# Patient Record
Sex: Female | Born: 1945 | Race: White | Hispanic: No | Marital: Single | Smoking: Never smoker
Health system: Southern US, Community
[De-identification: ages and names within clinical notes are randomized; demographics above are authoritative.]

## PROBLEM LIST (undated history)

## (undated) DIAGNOSIS — C801 Malignant (primary) neoplasm, unspecified: Secondary | ICD-10-CM

## (undated) DIAGNOSIS — E785 Hyperlipidemia, unspecified: Secondary | ICD-10-CM

## (undated) DIAGNOSIS — I10 Essential (primary) hypertension: Secondary | ICD-10-CM

## (undated) DIAGNOSIS — E119 Type 2 diabetes mellitus without complications: Secondary | ICD-10-CM

## (undated) HISTORY — PX: ABDOMINAL HYSTERECTOMY: SHX81

## (undated) HISTORY — PX: BREAST SURGERY: SHX581

---

## 2012-09-23 DIAGNOSIS — C50919 Malignant neoplasm of unspecified site of unspecified female breast: Secondary | ICD-10-CM | POA: Insufficient documentation

## 2018-09-01 ENCOUNTER — Encounter (HOSPITAL_COMMUNITY): Payer: Self-pay | Admitting: Emergency Medicine

## 2018-09-01 ENCOUNTER — Observation Stay (HOSPITAL_COMMUNITY)
Admission: EM | Admit: 2018-09-01 | Discharge: 2018-09-03 | Disposition: A | Discharge status: Home / Self Care | Payer: Medicare Other | Attending: Internal Medicine | Admitting: Internal Medicine

## 2018-09-01 ENCOUNTER — Emergency Department (HOSPITAL_COMMUNITY): Payer: Medicare Other

## 2018-09-01 ENCOUNTER — Other Ambulatory Visit: Payer: Self-pay

## 2018-09-01 DIAGNOSIS — F329 Major depressive disorder, single episode, unspecified: Secondary | ICD-10-CM | POA: Insufficient documentation

## 2018-09-01 DIAGNOSIS — S32512A Fracture of superior rim of left pubis, initial encounter for closed fracture: Principal | ICD-10-CM | POA: Insufficient documentation

## 2018-09-01 DIAGNOSIS — R51 Headache: Secondary | ICD-10-CM | POA: Insufficient documentation

## 2018-09-01 DIAGNOSIS — S3219XA Other fracture of sacrum, initial encounter for closed fracture: Secondary | ICD-10-CM | POA: Diagnosis not present

## 2018-09-01 DIAGNOSIS — Y9389 Activity, other specified: Secondary | ICD-10-CM | POA: Insufficient documentation

## 2018-09-01 DIAGNOSIS — Z853 Personal history of malignant neoplasm of breast: Secondary | ICD-10-CM | POA: Insufficient documentation

## 2018-09-01 DIAGNOSIS — S32511A Fracture of superior rim of right pubis, initial encounter for closed fracture: Secondary | ICD-10-CM | POA: Diagnosis not present

## 2018-09-01 DIAGNOSIS — E119 Type 2 diabetes mellitus without complications: Secondary | ICD-10-CM | POA: Insufficient documentation

## 2018-09-01 DIAGNOSIS — S32591A Other specified fracture of right pubis, initial encounter for closed fracture: Secondary | ICD-10-CM | POA: Diagnosis not present

## 2018-09-01 DIAGNOSIS — Z1159 Encounter for screening for other viral diseases: Secondary | ICD-10-CM | POA: Diagnosis not present

## 2018-09-01 DIAGNOSIS — E785 Hyperlipidemia, unspecified: Secondary | ICD-10-CM | POA: Insufficient documentation

## 2018-09-01 DIAGNOSIS — N179 Acute kidney failure, unspecified: Secondary | ICD-10-CM | POA: Diagnosis not present

## 2018-09-01 DIAGNOSIS — Y929 Unspecified place or not applicable: Secondary | ICD-10-CM | POA: Diagnosis not present

## 2018-09-01 DIAGNOSIS — I129 Hypertensive chronic kidney disease with stage 1 through stage 4 chronic kidney disease, or unspecified chronic kidney disease: Secondary | ICD-10-CM | POA: Insufficient documentation

## 2018-09-01 DIAGNOSIS — Y998 Other external cause status: Secondary | ICD-10-CM | POA: Diagnosis not present

## 2018-09-01 DIAGNOSIS — D72829 Elevated white blood cell count, unspecified: Secondary | ICD-10-CM | POA: Diagnosis present

## 2018-09-01 DIAGNOSIS — N183 Chronic kidney disease, stage 3 unspecified: Secondary | ICD-10-CM

## 2018-09-01 DIAGNOSIS — S3210XA Unspecified fracture of sacrum, initial encounter for closed fracture: Secondary | ICD-10-CM | POA: Diagnosis present

## 2018-09-01 DIAGNOSIS — S32810A Multiple fractures of pelvis with stable disruption of pelvic ring, initial encounter for closed fracture: Secondary | ICD-10-CM

## 2018-09-01 DIAGNOSIS — I10 Essential (primary) hypertension: Secondary | ICD-10-CM

## 2018-09-01 DIAGNOSIS — M25551 Pain in right hip: Secondary | ICD-10-CM | POA: Diagnosis present

## 2018-09-01 DIAGNOSIS — W19XXXA Unspecified fall, initial encounter: Secondary | ICD-10-CM

## 2018-09-01 DIAGNOSIS — D72828 Other elevated white blood cell count: Secondary | ICD-10-CM

## 2018-09-01 DIAGNOSIS — S32592A Other specified fracture of left pubis, initial encounter for closed fracture: Secondary | ICD-10-CM

## 2018-09-01 HISTORY — DX: Hyperlipidemia, unspecified: E78.5

## 2018-09-01 HISTORY — DX: Essential (primary) hypertension: I10

## 2018-09-01 HISTORY — DX: Type 2 diabetes mellitus without complications: E11.9

## 2018-09-01 HISTORY — DX: Malignant (primary) neoplasm, unspecified: C80.1

## 2018-09-01 LAB — GLUCOSE, CAPILLARY: Glucose-Capillary: 108 mg/dL — ABNORMAL HIGH (ref 70–99)

## 2018-09-01 LAB — COMPREHENSIVE METABOLIC PANEL
ALT: 22 U/L (ref 0–44)
AST: 30 U/L (ref 15–41)
Albumin: 3.8 g/dL (ref 3.5–5.0)
Alkaline Phosphatase: 41 U/L (ref 38–126)
Anion gap: 13 (ref 5–15)
BUN: 33 mg/dL — ABNORMAL HIGH (ref 8–23)
CO2: 25 mmol/L (ref 22–32)
Calcium: 9.3 mg/dL (ref 8.9–10.3)
Chloride: 101 mmol/L (ref 98–111)
Creatinine, Ser: 1.66 mg/dL — ABNORMAL HIGH (ref 0.44–1.00)
GFR calc Af Amer: 36 mL/min — ABNORMAL LOW (ref >60)
GFR calc non Af Amer: 31 mL/min — ABNORMAL LOW (ref >60)
Glucose, Bld: 159 mg/dL — ABNORMAL HIGH (ref 70–99)
Potassium: 4 mmol/L (ref 3.5–5.1)
Sodium: 139 mmol/L (ref 135–145)
Total Bilirubin: 0.7 mg/dL (ref 0.3–1.2)
Total Protein: 6.8 g/dL (ref 6.5–8.1)

## 2018-09-01 LAB — CBC WITH DIFFERENTIAL/PLATELET
Abs Immature Granulocytes: 0.12 10*3/uL — ABNORMAL HIGH (ref 0.00–0.07)
Basophils Absolute: 0 10*3/uL (ref 0.0–0.1)
Basophils Relative: 0 %
Eosinophils Absolute: 0.1 10*3/uL (ref 0.0–0.5)
Eosinophils Relative: 0 %
HCT: 38.6 % (ref 36.0–46.0)
Hemoglobin: 12.3 g/dL (ref 12.0–15.0)
Immature Granulocytes: 1 %
Lymphocytes Relative: 11 %
Lymphs Abs: 1.7 10*3/uL (ref 0.7–4.0)
MCH: 29.3 pg (ref 26.0–34.0)
MCHC: 31.9 g/dL (ref 30.0–36.0)
MCV: 91.9 fL (ref 80.0–100.0)
Monocytes Absolute: 0.7 10*3/uL (ref 0.1–1.0)
Monocytes Relative: 5 %
Neutro Abs: 12.4 10*3/uL — ABNORMAL HIGH (ref 1.7–7.7)
Neutrophils Relative %: 83 %
Platelets: 212 10*3/uL (ref 150–400)
RBC: 4.2 MIL/uL (ref 3.87–5.11)
RDW: 12.7 % (ref 11.5–15.5)
WBC: 15.1 10*3/uL — ABNORMAL HIGH (ref 4.0–10.5)
nRBC: 0 % (ref 0.0–0.2)

## 2018-09-01 LAB — SARS CORONAVIRUS 2 BY RT PCR (HOSPITAL ORDER, PERFORMED IN ~~LOC~~ HOSPITAL LAB): SARS Coronavirus 2: NEGATIVE

## 2018-09-01 MED ORDER — IBUPROFEN 600 MG PO TABS
600.0000 mg | ORAL_TABLET | Freq: Four times a day (QID) | ORAL | Status: DC
  Administered 2018-09-01 – 2018-09-02 (×2): 600 mg via ORAL
  Filled 2018-09-01 (×2): qty 1

## 2018-09-01 MED ORDER — ONDANSETRON HCL 4 MG/2ML IJ SOLN
4.0000 mg | Freq: Once | INTRAMUSCULAR | Status: AC
  Administered 2018-09-01: 4 mg via INTRAVENOUS
  Filled 2018-09-01: qty 2

## 2018-09-01 MED ORDER — FENTANYL CITRATE (PF) 100 MCG/2ML IJ SOLN
50.0000 ug | INTRAMUSCULAR | Status: DC | PRN
  Administered 2018-09-01: 50 ug via INTRAVENOUS
  Filled 2018-09-01: qty 2

## 2018-09-01 MED ORDER — ONDANSETRON HCL 4 MG/2ML IJ SOLN: 4.0000 mg | Freq: Four times a day (QID) | INTRAMUSCULAR | Status: DC | PRN

## 2018-09-01 MED ORDER — OXYCODONE-ACETAMINOPHEN 5-325 MG PO TABS
1.0000 | ORAL_TABLET | ORAL | Status: DC | PRN
  Administered 2018-09-02 – 2018-09-03 (×3): 1 via ORAL
  Filled 2018-09-01 (×3): qty 1

## 2018-09-01 MED ORDER — ONDANSETRON HCL 4 MG PO TABS: 4.0000 mg | ORAL_TABLET | Freq: Four times a day (QID) | ORAL | Status: DC | PRN

## 2018-09-01 MED ORDER — LORAZEPAM 2 MG/ML IJ SOLN
1.0000 mg | Freq: Once | INTRAMUSCULAR | Status: AC
  Administered 2018-09-01: 1 mg via INTRAVENOUS
  Filled 2018-09-01: qty 1

## 2018-09-01 MED ORDER — SODIUM CHLORIDE 0.9 % IV SOLN
INTRAVENOUS | Status: DC
  Administered 2018-09-01: 23:00:00 via INTRAVENOUS

## 2018-09-01 MED ORDER — SODIUM CHLORIDE 0.9 % IV BOLUS
1000.0000 mL | Freq: Once | INTRAVENOUS | Status: AC
  Administered 2018-09-01: 1000 mL via INTRAVENOUS

## 2018-09-01 MED ORDER — ENOXAPARIN SODIUM 60 MG/0.6ML ~~LOC~~ SOLN
50.0000 mg | SUBCUTANEOUS | Status: DC
  Administered 2018-09-01: 50 mg via SUBCUTANEOUS
  Filled 2018-09-01 (×2): qty 0.6

## 2018-09-01 NOTE — ED Triage Notes (Signed)
Pt fell off her horse and landed on RT hip. Pt did hit head, but denies LOC. Pt c/o RT hip and groin pain. No deformity, shortening, or rotation noted.

## 2018-09-01 NOTE — Progress Notes (Signed)
I have reviewed xrays and CT scan.  Pt has stable pelvic ring fractures and is appropriate to stay at [facility] for [facility] and mobilization.  She can be WBAT to BLE.  I will see her in the office in 2 weeks.   Deborah Warner

## 2018-09-01 NOTE — H&P (Signed)
History and Physical  Deborah Warner JKD:[id number] DOB: 1945-11-01 DOA: 09/01/2018  Referring physician: Dr Gilford Raid, ED physician PCP: System, Pcp Not In  Outpatient Specialists:   Patient Coming From: Home  Chief Complaint: Fall off horse with sacral pain  HPI: Deborah Warner is a 73 y.o. female with a history of endometrial cancer, diabetes, hypertension, hyperlipidemia.  Patient seen after a fall off her horse and landing on her buttocks.  Patient had immediate pain that was nonradiating and quite severe.  Worse with movement and improved with lying still.  CT shows superior and inferior pubic rami fractures bilaterally as well as mildly displaced right sacral fracture.  Emergency Department Course: Ortho consulted.  Fracture is nonoperable.  Patient to remain nonweightbearing.  Review of Systems:  Pt denies any fevers, chills, nausea, vomiting, diarrhea, constipation, abdominal pain, shortness of breath, dyspnea on exertion, orthopnea, cough, wheezing, palpitations, headache, vision changes, lightheadedness, dizziness, melena, rectal bleeding.  Review of systems are otherwise negative  Past Medical History:  Diagnosis Date  . Cancer ([place])    endometrial   . Diabetes mellitus without complication ([place])   . Hyperlipidemia   . Hypertension    Past Surgical History:  Procedure Laterality Date  . ABDOMINAL HYSTERECTOMY    . BREAST SURGERY     Social History:  reports that she has never smoked. She has never used smokeless tobacco. She reports current alcohol use. She reports that she does not use drugs. Patient lives at home  Allergies  Allergen Reactions  . Augmentin [Amoxicillin-Pot Clavulanate] Hives  . Codeine Palpitations    Family history of diabetes  Prior to Admission medications   Not on File    Physical Exam: BP (!) 86/48   Pulse 73   Temp 97.6 F (36.4 C)   Resp 20   Ht 5\' 6"  (1.676 m)   Wt 104.3 kg   SpO2 91%   BMI 37.12 kg/m   . General: Older  Caucasian female.. Awake and alert and oriented x3. No acute cardiopulmonary distress.  Marland Kitchen HEENT: Normocephalic atraumatic.  Right and left ears normal in appearance.  Pupils equal, round, reactive to light. Extraocular muscles are intact. Sclerae anicteric and noninjected.  Moist mucosal membranes. No mucosal lesions.  . Neck: Neck supple without lymphadenopathy. No carotid bruits. No masses palpated.  . Cardiovascular: Regular rate with normal S1-S2 sounds. No murmurs, rubs, gallops auscultated. No JVD.  Marland Kitchen Respiratory: Good respiratory effort with no wheezes, rales, rhonchi. Lungs clear to auscultation bilaterally.  No accessory muscle use. . Abdomen: Soft, nontender, nondistended. Active bowel sounds. No masses or hepatosplenomegaly  . Skin: No rashes, lesions, or ulcerations.  Dry, warm to touch. 2+ dorsalis pedis and radial pulses. . Musculoskeletal: Neurovascularly tact in legs bilaterally. Marland Kitchen Psychiatric: Intact judgment and insight. Pleasant and cooperative. . Neurologic: No focal neurological deficits.  Due to fracture, muscle testing not done..  Cranial nerves II through XII are grossly intact.           Labs on Admission: I have personally reviewed following labs and imaging studies  CBC: Recent Labs  Lab 09/01/18 1647  WBC 15.1*  NEUTROABS 12.4*  HGB 12.3  HCT 38.6  MCV 91.9  PLT 099   Basic Metabolic Panel: Recent Labs  Lab 09/01/18 1647  NA 139  K 4.0  CL 101  CO2 25  GLUCOSE 159*  BUN 33*  CREATININE 1.66*  CALCIUM 9.3   GFR: Estimated Creatinine Clearance: 37.9 mL/min (A) (by C-G formula based  on SCr of 1.66 mg/dL (H)). Liver Function Tests: Recent Labs  Lab 09/01/18 1647  AST 30  ALT 22  ALKPHOS 41  BILITOT 0.7  PROT 6.8  ALBUMIN 3.8   No results for input(s): LIPASE, AMYLASE in the last 168 hours. No results for input(s): AMMONIA in the last 168 hours. Coagulation Profile: No results for input(s): INR, PROTIME in the last 168 hours. Cardiac  Enzymes: No results for input(s): CKTOTAL, CKMB, CKMBINDEX, TROPONINI in the last 168 hours. BNP (last 3 results) No results for input(s): PROBNP in the last 8760 hours. HbA1C: No results for input(s): HGBA1C in the last 72 hours. CBG: No results for input(s): GLUCAP in the last 168 hours. Lipid Profile: No results for input(s): CHOL, HDL, LDLCALC, TRIG, CHOLHDL, LDLDIRECT in the last 72 hours. Thyroid Function Tests: No results for input(s): TSH, T4TOTAL, FREET4, T3FREE, THYROIDAB in the last 72 hours. Anemia Panel: No results for input(s): VITAMINB12, FOLATE, FERRITIN, TIBC, IRON, RETICCTPCT in the last 72 hours. Urine analysis: No results found for: COLORURINE, APPEARANCEUR, LABSPEC, PHURINE, GLUCOSEU, HGBUR, BILIRUBINUR, KETONESUR, PROTEINUR, UROBILINOGEN, NITRITE, LEUKOCYTESUR Sepsis Labs: @LABRCNTIP (procalcitonin:4,lacticidven:4) ) Recent Results (from the past 240 hour(s))  SARS Coronavirus 2 (CEPHEID - Performed in [facility] hospital lab), Hosp Order     Status: None   Collection Time: 09/01/18  5:24 PM  Result Value Ref Range Status   SARS Coronavirus 2 NEGATIVE NEGATIVE Final    Comment: (NOTE) If result is NEGATIVE SARS-CoV-2 target nucleic acids are NOT DETECTED. The SARS-CoV-2 RNA is generally detectable in upper and lower  respiratory specimens during the acute phase of infection. The lowest  concentration of SARS-CoV-2 viral copies this assay can detect is 250  copies / mL. A negative result does not preclude SARS-CoV-2 infection  and should not be used as the sole basis for treatment or other  patient management decisions.  A negative result may occur with  improper specimen collection / handling, submission of specimen other  than nasopharyngeal swab, presence of viral mutation(s) within the  areas targeted by this assay, and inadequate number of viral copies  (<250 copies / mL). A negative result must be combined with clinical  observations, patient history,  and epidemiological information. If result is POSITIVE SARS-CoV-2 target nucleic acids are DETECTED. The SARS-CoV-2 RNA is generally detectable in upper and lower  respiratory specimens dur ing the acute phase of infection.  Positive  results are indicative of active infection with SARS-CoV-2.  Clinical  correlation with patient history and other diagnostic information is  necessary to determine patient infection status.  Positive results do  not rule out bacterial infection or co-infection with other viruses. If result is PRESUMPTIVE POSTIVE SARS-CoV-2 nucleic acids MAY BE PRESENT.   A presumptive positive result was obtained on the submitted specimen  and confirmed on repeat testing.  While 2019 novel coronavirus  (SARS-CoV-2) nucleic acids may be present in the submitted sample  additional confirmatory testing may be necessary for epidemiological  and / or clinical management purposes  to differentiate between  SARS-CoV-2 and other Sarbecovirus currently known to infect humans.  If clinically indicated additional testing with an alternate test  methodology [phone]) is advised. The SARS-CoV-2 RNA is generally  detectable in upper and lower respiratory sp ecimens during the acute  phase of infection. The expected result is Negative. Fact Sheet for Patients:  StrictlyIdeas.no Fact Sheet for Healthcare Providers: BankingDealers.co.za This test is not yet approved or cleared by the [place] FDA and has  been authorized for detection and/or diagnosis of SARS-CoV-2 by FDA under an Emergency Use Authorization (EUA).  This EUA will remain in effect (meaning this test can be used) for the duration of the COVID-19 declaration under Section 564(b)(1) of the Act, 21 U.S.C. section 360bbb-3(b)(1), unless the authorization is terminated or revoked sooner. Performed at [facility], [address] [place], [place] [address]      Radiological  Exams on Admission: Dg Chest 2 View  Result Date: 09/01/2018 CLINICAL DATA:  Acute pain following fall from horse today. Initial encounter. EXAM: CHEST - 2 VIEW COMPARISON:  None. FINDINGS: The cardiomediastinal silhouette is unremarkable. There is no evidence of focal airspace disease, pulmonary edema, suspicious pulmonary nodule/mass, pleural effusion, or pneumothorax. No acute bony abnormalities are identified. IMPRESSION: No active cardiopulmonary disease. Electronically Signed   By: Margarette Canada M.D.   On: 09/01/2018 16:47   Dg Lumbar Spine Complete  Result Date: 09/01/2018 CLINICAL DATA:  Fall from horse with low back pain, initial encounter EXAM: LUMBAR SPINE - COMPLETE 4+ VIEW COMPARISON:  None. FINDINGS: Calcified gallstones are noted in the right upper quadrant. Five lumbar type vertebral bodies are well visualized. Body height is well maintained. Multilevel osteophytic changes and disc space narrowing noted. No pars defects are seen. No anterolisthesis is noted. IMPRESSION: Degenerative change without acute abnormality. Cholelithiasis in the upper quadrant. Electronically Signed   By: Inez Catalina M.D.   On: 09/01/2018 16:40   Ct Head Wo Contrast  Result Date: 09/01/2018 CLINICAL DATA:  Fall from horse today.  Posterior head neck pain. EXAM: CT HEAD WITHOUT CONTRAST CT CERVICAL SPINE WITHOUT CONTRAST TECHNIQUE: Multidetector CT imaging of the head and cervical spine was performed following the standard protocol without intravenous contrast. Multiplanar CT image reconstructions of the cervical spine were also generated. COMPARISON:  None. FINDINGS: CT HEAD FINDINGS Brain: The brainstem, cerebellum, cerebral peduncles, thalami, basal ganglia, basilar cisterns, and ventricular system appear within normal limits. No intracranial hemorrhage, mass lesion, or acute CVA. Vascular: There is atherosclerotic calcification of the cavernous carotid arteries bilaterally. Skull: Unremarkable Sinuses/Orbits: Mild  chronic right frontal sinusitis. Other: No supplemental non-categorized findings. CT CERVICAL SPINE FINDINGS Alignment: No vertebral subluxation is observed. Skull base and vertebrae: No fracture or acute bony findings. Mild pannus posterior to the odontoid measuring 4 mm in thickness. Degenerative disc disease with loss of disc height at C4-5, C5-6, and C6-7. Left facet arthropathy especially at C2-3 and C3-4. Soft tissues and spinal canal: Unremarkable Disc levels: Mild foraminal narrowing due to uncinate and facet spurring on the left at C4-5, C5-6, and C6-7; and on the right at C4-5 and C5-6. Suspected mild central narrowing of the thecal sac at C6-7 due to spurring and short pedicles. Upper chest: Unremarkable Other: No supplemental non-categorized findings. IMPRESSION: 1. No acute intracranial findings and no acute cervical spine findings. 2. Atherosclerosis. 3. Mild chronic right frontal sinusitis. 4. Cervical spondylosis and degenerative disc disease with suspected impingement at C4-5, C5-6, and C6-7 due to spurring. Electronically Signed   By: Van Clines M.D.   On: 09/01/2018 16:23   Ct Cervical Spine Wo Contrast  Result Date: 09/01/2018 CLINICAL DATA:  Fall from horse today.  Posterior head neck pain. EXAM: CT HEAD WITHOUT CONTRAST CT CERVICAL SPINE WITHOUT CONTRAST TECHNIQUE: Multidetector CT imaging of the head and cervical spine was performed following the standard protocol without intravenous contrast. Multiplanar CT image reconstructions of the cervical spine were also generated. COMPARISON:  None. FINDINGS: CT HEAD FINDINGS Brain:  The brainstem, cerebellum, cerebral peduncles, thalami, basal ganglia, basilar cisterns, and ventricular system appear within normal limits. No intracranial hemorrhage, mass lesion, or acute CVA. Vascular: There is atherosclerotic calcification of the cavernous carotid arteries bilaterally. Skull: Unremarkable Sinuses/Orbits: Mild chronic right frontal sinusitis.  Other: No supplemental non-categorized findings. CT CERVICAL SPINE FINDINGS Alignment: No vertebral subluxation is observed. Skull base and vertebrae: No fracture or acute bony findings. Mild pannus posterior to the odontoid measuring 4 mm in thickness. Degenerative disc disease with loss of disc height at C4-5, C5-6, and C6-7. Left facet arthropathy especially at C2-3 and C3-4. Soft tissues and spinal canal: Unremarkable Disc levels: Mild foraminal narrowing due to uncinate and facet spurring on the left at C4-5, C5-6, and C6-7; and on the right at C4-5 and C5-6. Suspected mild central narrowing of the thecal sac at C6-7 due to spurring and short pedicles. Upper chest: Unremarkable Other: No supplemental non-categorized findings. IMPRESSION: 1. No acute intracranial findings and no acute cervical spine findings. 2. Atherosclerosis. 3. Mild chronic right frontal sinusitis. 4. Cervical spondylosis and degenerative disc disease with suspected impingement at C4-5, C5-6, and C6-7 due to spurring. Electronically Signed   By: Van Clines M.D.   On: 09/01/2018 16:23   Ct Pelvis Wo Contrast  Result Date: 09/01/2018 CLINICAL DATA:  Recent pelvic trauma with no known pubic rami fractures, evaluate for additional fractures. EXAM: CT PELVIS WITHOUT CONTRAST TECHNIQUE: Multidetector CT imaging of the pelvis was performed following the standard protocol without intravenous contrast. COMPARISON:  None. FINDINGS: Urinary Tract:  Bladder is decompressed. Bowel:  Visualized large and small bowel is within normal limits. Vascular/Lymphatic: Vascular calcifications are seen without aneurysmal dilatation. No significant lymphadenopathy is noted. Reproductive:  Uterus has been surgically removed. Other:  No free fluid is noted.  No soft tissue abnormality is seen. Musculoskeletal: Degenerative changes of lumbar spine are noted. Mildly displaced fracture through the right sacral ala is seen no definitive left sacral fracture is  seen. Minimally displaced superior and inferior pubic rami fractures are noted bilaterally. No focal hematoma is noted. No proximal femoral fractures are seen. No acetabular involvement is seen. IMPRESSION: Superior and inferior pubic rami fractures bilaterally as well as mildly displaced right sacral fracture. Electronically Signed   By: Inez Catalina M.D.   On: 09/01/2018 18:20   Dg Hip Unilat W Or Wo Pelvis 2-3 Views Right  Result Date: 09/01/2018 CLINICAL DATA:  73 year old female with fall and right hip pain EXAM: DG HIP (WITH OR WITHOUT PELVIS) 2-3V RIGHT COMPARISON:  None. FINDINGS: Bilateral superior and inferior pubic rami fractures, minimally displaced. Bilateral hips projects normally over the acetabula. No acute proximal femur fracture identified on the left or the right. Degenerative changes of the bilateral hips. IMPRESSION: Bilateral minimally displaced fractures of the superior and inferior pubic ramus. Electronically Signed   By: Corrie Mckusick D.O.   On: 09/01/2018 16:40     Assessment/Plan: Principal Problem:   Sacral fracture, closed ([place]) Active Problems:   Bilateral pubic rami fractures ([facility])   Diabetes mellitus without complication ([place])   Essential hypertension   Leukocytosis   Acute renal injury ([place])    This patient was discussed with the ED physician, including pertinent vitals, physical exam findings, labs, and imaging.  We also discussed care given by the ED provider.  1. Sacral fracture a. Observation b. PT consult c. Pain control d. Care management consult e. Patient to remain nonweightbearing on the right side 2. Closed fracture pubic rami bilaterally a.  3. Diabetes a. Continue metformin 4. Hypertension a. We will hold antihypertensives as blood pressure little low 5. Leukocytosis a. CBC in the morning b. No sign of infection 6. Acute renal injury a. Recheck creatinine in the morning. b. IV fluids  DVT prophylaxis: Lovenox Consultants: Ortho  Code Status: Full code Family Communication: None Disposition Plan: Patient should be able to return home following observation   Truett Mainland, DO

## 2018-09-01 NOTE — ED Provider Notes (Signed)
[facility] EMERGENCY DEPARTMENT Provider Note   CSN: [id number] Arrival date & time: 09/01/18  1534    History   Chief Complaint Chief Complaint  Patient presents with  . Fall    HPI Deborah Warner is a 73 y.o. female.     Pt presents to the ED today with right hip pain after fall from horse.  Pt said she was cantering when the horse in front of her stopped suddenly.  This caused her to fall off her horse.  She has been unable to put any weight onto her hip since the fall.  She thinks she hit her head, but no loc.  She is not on blood thinners.  No known sick contacts.     Past Medical History:  Diagnosis Date  . Cancer ([place])    endometrial   . Diabetes mellitus without complication ([place])   . Hyperlipidemia   . Hypertension     There are no active problems to display for this patient.   Past Surgical History:  Procedure Laterality Date  . ABDOMINAL HYSTERECTOMY    . BREAST SURGERY       OB History   No obstetric history on file.      Home Medications    Prior to Admission medications   Not on File    Family History No family history on file.  Social History Social History   Tobacco Use  . Smoking status: Never Smoker  . Smokeless tobacco: Never Used  Substance Use Topics  . Alcohol use: Yes    Comment: occ  . Drug use: Never     Allergies   Augmentin [amoxicillin-pot clavulanate] and Codeine   Review of Systems Review of Systems  Musculoskeletal:       Right hip pain  All other systems reviewed and are negative.    Physical Exam Updated Vital Signs BP (!) 86/48   Pulse 73   Temp 97.6 F (36.4 C)   Resp 20   Ht 5\' 6"  (1.676 m)   Wt 104.3 kg   SpO2 91%   BMI 37.12 kg/m   Physical Exam Vitals signs and nursing note reviewed.  Constitutional:      Appearance: Normal appearance.  HENT:     Head: Normocephalic and atraumatic.     Right Ear: External ear normal.     Left Ear: External ear normal.     Nose: Nose normal.      Mouth/Throat:     Mouth: Mucous membranes are moist.     Pharynx: Oropharynx is clear.  Eyes:     Extraocular Movements: Extraocular movements intact.     Conjunctiva/sclera: Conjunctivae normal.     Pupils: Pupils are equal, round, and reactive to light.  Neck:     Musculoskeletal: Normal range of motion.  Cardiovascular:     Rate and Rhythm: Normal rate and regular rhythm.     Pulses: Normal pulses.     Heart sounds: Normal heart sounds.  Pulmonary:     Effort: Pulmonary effort is normal.     Breath sounds: Normal breath sounds.  Abdominal:     General: Abdomen is flat. Bowel sounds are normal.     Palpations: Abdomen is soft.  Musculoskeletal:     Right hip: She exhibits tenderness.  Skin:    General: Skin is warm.     Capillary Refill: Capillary refill takes less than 2 seconds.  Neurological:     General: No focal deficit present.  Mental Status: She is alert and oriented to person, place, and time.  Psychiatric:        Mood and Affect: Mood normal.        Behavior: Behavior normal.      ED Treatments / Results  Labs (all labs ordered are listed, but only abnormal results are displayed) Labs Reviewed  CBC WITH DIFFERENTIAL/PLATELET - Abnormal; Notable for the following components:      Result Value   WBC 15.1 (*)    Neutro Abs 12.4 (*)    Abs Immature Granulocytes 0.12 (*)    All other components within normal limits  COMPREHENSIVE METABOLIC PANEL - Abnormal; Notable for the following components:   Glucose, Bld 159 (*)    BUN 33 (*)    Creatinine, Ser 1.66 (*)    GFR calc non Af Amer 31 (*)    GFR calc Af Amer 36 (*)    All other components within normal limits  SARS CORONAVIRUS 2 (HOSPITAL ORDER, [place] [facility])    EKG None  Radiology Dg Chest 2 View  Result Date: 09/01/2018 CLINICAL DATA:  Acute pain following fall from horse today. Initial encounter. EXAM: CHEST - 2 VIEW COMPARISON:  None. FINDINGS: The cardiomediastinal  silhouette is unremarkable. There is no evidence of focal airspace disease, pulmonary edema, suspicious pulmonary nodule/mass, pleural effusion, or pneumothorax. No acute bony abnormalities are identified. IMPRESSION: No active cardiopulmonary disease. Electronically Signed   By: Margarette Canada M.D.   On: 09/01/2018 16:47   Dg Lumbar Spine Complete  Result Date: 09/01/2018 CLINICAL DATA:  Fall from horse with low back pain, initial encounter EXAM: LUMBAR SPINE - COMPLETE 4+ VIEW COMPARISON:  None. FINDINGS: Calcified gallstones are noted in the right upper quadrant. Five lumbar type vertebral bodies are well visualized. Body height is well maintained. Multilevel osteophytic changes and disc space narrowing noted. No pars defects are seen. No anterolisthesis is noted. IMPRESSION: Degenerative change without acute abnormality. Cholelithiasis in the upper quadrant. Electronically Signed   By: Inez Catalina M.D.   On: 09/01/2018 16:40   Ct Head Wo Contrast  Result Date: 09/01/2018 CLINICAL DATA:  Fall from horse today.  Posterior head neck pain. EXAM: CT HEAD WITHOUT CONTRAST CT CERVICAL SPINE WITHOUT CONTRAST TECHNIQUE: Multidetector CT imaging of the head and cervical spine was performed following the standard protocol without intravenous contrast. Multiplanar CT image reconstructions of the cervical spine were also generated. COMPARISON:  None. FINDINGS: CT HEAD FINDINGS Brain: The brainstem, cerebellum, cerebral peduncles, thalami, basal ganglia, basilar cisterns, and ventricular system appear within normal limits. No intracranial hemorrhage, mass lesion, or acute CVA. Vascular: There is atherosclerotic calcification of the cavernous carotid arteries bilaterally. Skull: Unremarkable Sinuses/Orbits: Mild chronic right frontal sinusitis. Other: No supplemental non-categorized findings. CT CERVICAL SPINE FINDINGS Alignment: No vertebral subluxation is observed. Skull base and vertebrae: No fracture or acute bony  findings. Mild pannus posterior to the odontoid measuring 4 mm in thickness. Degenerative disc disease with loss of disc height at C4-5, C5-6, and C6-7. Left facet arthropathy especially at C2-3 and C3-4. Soft tissues and spinal canal: Unremarkable Disc levels: Mild foraminal narrowing due to uncinate and facet spurring on the left at C4-5, C5-6, and C6-7; and on the right at C4-5 and C5-6. Suspected mild central narrowing of the thecal sac at C6-7 due to spurring and short pedicles. Upper chest: Unremarkable Other: No supplemental non-categorized findings. IMPRESSION: 1. No acute intracranial findings and no acute cervical spine findings. 2.  Atherosclerosis. 3. Mild chronic right frontal sinusitis. 4. Cervical spondylosis and degenerative disc disease with suspected impingement at C4-5, C5-6, and C6-7 due to spurring. Electronically Signed   By: Van Clines M.D.   On: 09/01/2018 16:23   Ct Cervical Spine Wo Contrast  Result Date: 09/01/2018 CLINICAL DATA:  Fall from horse today.  Posterior head neck pain. EXAM: CT HEAD WITHOUT CONTRAST CT CERVICAL SPINE WITHOUT CONTRAST TECHNIQUE: Multidetector CT imaging of the head and cervical spine was performed following the standard protocol without intravenous contrast. Multiplanar CT image reconstructions of the cervical spine were also generated. COMPARISON:  None. FINDINGS: CT HEAD FINDINGS Brain: The brainstem, cerebellum, cerebral peduncles, thalami, basal ganglia, basilar cisterns, and ventricular system appear within normal limits. No intracranial hemorrhage, mass lesion, or acute CVA. Vascular: There is atherosclerotic calcification of the cavernous carotid arteries bilaterally. Skull: Unremarkable Sinuses/Orbits: Mild chronic right frontal sinusitis. Other: No supplemental non-categorized findings. CT CERVICAL SPINE FINDINGS Alignment: No vertebral subluxation is observed. Skull base and vertebrae: No fracture or acute bony findings. Mild pannus posterior  to the odontoid measuring 4 mm in thickness. Degenerative disc disease with loss of disc height at C4-5, C5-6, and C6-7. Left facet arthropathy especially at C2-3 and C3-4. Soft tissues and spinal canal: Unremarkable Disc levels: Mild foraminal narrowing due to uncinate and facet spurring on the left at C4-5, C5-6, and C6-7; and on the right at C4-5 and C5-6. Suspected mild central narrowing of the thecal sac at C6-7 due to spurring and short pedicles. Upper chest: Unremarkable Other: No supplemental non-categorized findings. IMPRESSION: 1. No acute intracranial findings and no acute cervical spine findings. 2. Atherosclerosis. 3. Mild chronic right frontal sinusitis. 4. Cervical spondylosis and degenerative disc disease with suspected impingement at C4-5, C5-6, and C6-7 due to spurring. Electronically Signed   By: Van Clines M.D.   On: 09/01/2018 16:23   Ct Pelvis Wo Contrast  Result Date: 09/01/2018 CLINICAL DATA:  Recent pelvic trauma with no known pubic rami fractures, evaluate for additional fractures. EXAM: CT PELVIS WITHOUT CONTRAST TECHNIQUE: Multidetector CT imaging of the pelvis was performed following the standard protocol without intravenous contrast. COMPARISON:  None. FINDINGS: Urinary Tract:  Bladder is decompressed. Bowel:  Visualized large and small bowel is within normal limits. Vascular/Lymphatic: Vascular calcifications are seen without aneurysmal dilatation. No significant lymphadenopathy is noted. Reproductive:  Uterus has been surgically removed. Other:  No free fluid is noted.  No soft tissue abnormality is seen. Musculoskeletal: Degenerative changes of lumbar spine are noted. Mildly displaced fracture through the right sacral ala is seen no definitive left sacral fracture is seen. Minimally displaced superior and inferior pubic rami fractures are noted bilaterally. No focal hematoma is noted. No proximal femoral fractures are seen. No acetabular involvement is seen. IMPRESSION:  Superior and inferior pubic rami fractures bilaterally as well as mildly displaced right sacral fracture. Electronically Signed   By: Inez Catalina M.D.   On: 09/01/2018 18:20   Dg Hip Unilat W Or Wo Pelvis 2-3 Views Right  Result Date: 09/01/2018 CLINICAL DATA:  73 year old female with fall and right hip pain EXAM: DG HIP (WITH OR WITHOUT PELVIS) 2-3V RIGHT COMPARISON:  None. FINDINGS: Bilateral superior and inferior pubic rami fractures, minimally displaced. Bilateral hips projects normally over the acetabula. No acute proximal femur fracture identified on the left or the right. Degenerative changes of the bilateral hips. IMPRESSION: Bilateral minimally displaced fractures of the superior and inferior pubic ramus. Electronically Signed   By: Corrie Mckusick  D.O.   On: 09/01/2018 16:40    Procedures Procedures (including critical care time)  Medications Ordered in ED Medications  fentaNYL (SUBLIMAZE) injection 50 mcg (50 mcg Intravenous Given 09/01/18 1638)  sodium chloride 0.9 % bolus 1,000 mL (has no administration in time range)  ondansetron (ZOFRAN) injection 4 mg (4 mg Intravenous Given 09/01/18 1638)  LORazepam (ATIVAN) injection 1 mg (1 mg Intravenous Given 09/01/18 1820)     Initial Impression / Assessment and Plan / ED Course  I have reviewed the triage vital signs and the nursing notes.  Pertinent labs & imaging results that were available during my care of the patient were reviewed by me and considered in my medical decision making (see chart for details).     Pt d/w Dr. Veverly Fells (ortho) who recommended CT pelvis.  This was done which did show a right sided sacral fracture.  He said she can WBAT on the left, but she needs to be non weight bearing on the right.  She is not surgical and can stay here at [facility].  He can f/u after she is discharged.  Pt d/w Dr. Nehemiah Settle (triad) for admission.  BP and O2 sat dropped a little after pain meds.  Pt given 1L and put on 2L oxygen.  Final Clinical  Impressions(s) / ED Diagnoses   Final diagnoses:  Multiple closed fractures of pelvis with stable disruption of pelvic ring, initial encounter ([place])  Fall, initial encounter  Closed fracture of sacrum, unspecified portion of sacrum, initial encounter [facility])    ED Discharge Orders    None       Isla Pence, MD 09/01/18 1924

## 2018-09-02 ENCOUNTER — Observation Stay (HOSPITAL_COMMUNITY): Payer: Medicare Other

## 2018-09-02 DIAGNOSIS — N179 Acute kidney failure, unspecified: Secondary | ICD-10-CM | POA: Diagnosis not present

## 2018-09-02 DIAGNOSIS — S32810A Multiple fractures of pelvis with stable disruption of pelvic ring, initial encounter for closed fracture: Secondary | ICD-10-CM

## 2018-09-02 DIAGNOSIS — S32512A Fracture of superior rim of left pubis, initial encounter for closed fracture: Secondary | ICD-10-CM | POA: Diagnosis not present

## 2018-09-02 DIAGNOSIS — I10 Essential (primary) hypertension: Secondary | ICD-10-CM | POA: Diagnosis not present

## 2018-09-02 DIAGNOSIS — N183 Chronic kidney disease, stage 3 unspecified: Secondary | ICD-10-CM

## 2018-09-02 DIAGNOSIS — S3210XA Unspecified fracture of sacrum, initial encounter for closed fracture: Secondary | ICD-10-CM | POA: Diagnosis not present

## 2018-09-02 LAB — CBC
HCT: 34.1 % — ABNORMAL LOW (ref 36.0–46.0)
Hemoglobin: 10.9 g/dL — ABNORMAL LOW (ref 12.0–15.0)
MCH: 29.5 pg (ref 26.0–34.0)
MCHC: 32 g/dL (ref 30.0–36.0)
MCV: 92.2 fL (ref 80.0–100.0)
Platelets: 174 10*3/uL (ref 150–400)
RBC: 3.7 MIL/uL — ABNORMAL LOW (ref 3.87–5.11)
RDW: 13.2 % (ref 11.5–15.5)
WBC: 8.1 10*3/uL (ref 4.0–10.5)
nRBC: 0 % (ref 0.0–0.2)

## 2018-09-02 LAB — BASIC METABOLIC PANEL
Anion gap: 11 (ref 5–15)
BUN: 42 mg/dL — ABNORMAL HIGH (ref 8–23)
CO2: 24 mmol/L (ref 22–32)
Calcium: 8.4 mg/dL — ABNORMAL LOW (ref 8.9–10.3)
Chloride: 104 mmol/L (ref 98–111)
Creatinine, Ser: 2.09 mg/dL — ABNORMAL HIGH (ref 0.44–1.00)
GFR calc Af Amer: 27 mL/min — ABNORMAL LOW (ref >60)
GFR calc non Af Amer: 23 mL/min — ABNORMAL LOW (ref >60)
Glucose, Bld: 113 mg/dL — ABNORMAL HIGH (ref 70–99)
Potassium: 4.1 mmol/L (ref 3.5–5.1)
Sodium: 139 mmol/L (ref 135–145)

## 2018-09-02 LAB — GLUCOSE, CAPILLARY
Glucose-Capillary: 103 mg/dL — ABNORMAL HIGH (ref 70–99)
Glucose-Capillary: 109 mg/dL — ABNORMAL HIGH (ref 70–99)
Glucose-Capillary: 108 mg/dL — ABNORMAL HIGH (ref 70–99)
Glucose-Capillary: 110 mg/dL — ABNORMAL HIGH (ref 70–99)

## 2018-09-02 LAB — HEMOGLOBIN A1C
Hgb A1c MFr Bld: 6.2 % — ABNORMAL HIGH (ref 4.8–5.6)
Mean Plasma Glucose: 131.24 mg/dL

## 2018-09-02 MED ORDER — METHOCARBAMOL 500 MG PO TABS: 500.0000 mg | ORAL_TABLET | Freq: Four times a day (QID) | ORAL | Status: DC | PRN

## 2018-09-02 MED ORDER — ENOXAPARIN SODIUM 30 MG/0.3ML ~~LOC~~ SOLN
30.0000 mg | SUBCUTANEOUS | Status: DC
  Administered 2018-09-02: 30 mg via SUBCUTANEOUS
  Filled 2018-09-02: qty 0.3

## 2018-09-02 MED ORDER — INSULIN ASPART 100 UNIT/ML ~~LOC~~ SOLN: 0.0000 [IU] | Freq: Three times a day (TID) | SUBCUTANEOUS | Status: DC

## 2018-09-02 MED ORDER — POLYETHYLENE GLYCOL 3350 17 G PO PACK
17.0000 g | PACK | Freq: Every day | ORAL | Status: DC
  Administered 2018-09-02 – 2018-09-03 (×2): 17 g via ORAL
  Filled 2018-09-02: qty 1

## 2018-09-02 MED ORDER — ACETAMINOPHEN 325 MG PO TABS: 650.0000 mg | ORAL_TABLET | Freq: Four times a day (QID) | ORAL | Status: DC | PRN

## 2018-09-02 MED ORDER — PRAVASTATIN SODIUM 40 MG PO TABS
80.0000 mg | ORAL_TABLET | Freq: Every day | ORAL | Status: DC
  Administered 2018-09-02: 80 mg via ORAL
  Filled 2018-09-02: qty 2

## 2018-09-02 NOTE — Progress Notes (Signed)
Rehab Admissions Coordinator Note:  Per request, this patient was screened by Jhonnie Garner for appropriateness of an Inpatient Acute Rehab Consult. Noted current PT recommendations are for SNF. Also noted pt is observation status at this time. Pt may not have the medical necessity to warrant an inpatient rehab stay if they remain observation. If status were to change to inpatient, [facility] will screen for candidacy.     Jhonnie Garner 09/02/2018, 12:49 PM  I can be reached at [phone].

## 2018-09-02 NOTE — Progress Notes (Signed)
PROGRESS NOTE  Deborah Warner ZDG:[id number] DOB: 1945-10-17 DOA: 09/01/2018 PCP: System, Pcp Not In  Brief History:  73 year old female with a history of diabetes mellitus, hyperlipidemia, hypertension, endometrial carcinoma, right-sided breast cancer presenting with a fall.  The patient was at a horse show in [place] when she was thrown off her horse landing on her buttocks.  She hit her head also but did not lose consciousness.  She had immediate pain and had difficulty bearing weight.  She was brought to emergency department for further evaluation.  CT of the pelvis showed a superior and inferior pubic rami fracture bilateral.  There is also a mildly displaced right sacral fracture.  CT of the brain was negative for any acute findings.  CT of the cervical spine show degenerative disc disease with impingement of C4-5, C5-6, C6-7.  BMP, LFTs, and CBC were essentially unremarkable except for WBC 15.1.  The patient was afebrile hemodynamically stable.  The patient did have a drop in her blood pressure and oxygen desaturation with intravenous opioids. The patient had been in her usual state of health prior to the current situation. Patient denies fevers, chills, headache, chest pain, dyspnea, nausea, vomiting, diarrhea, abdominal pain, dysuria, hematuria, hematochezia, and melena.   Assessment/Plan: Bilateral superior and inferior pubic rami fracture/sacral fracture -Nonoperative management per orthopedics -PT evaluation -Judicious opioids -Start MiraLAX  Acute on chronic renal failure--CKD stage III -Baseline creatinine 1.2-1.4 (from 2015) -pt likely has had progression of underlying CKD -d/c ibuprofen, lisinopril/HCTZ -due to hemodynamic changes and NSAID -renal US -UA with micro -A.m. BMP  Diabetes mellitus type 2 -Hemoglobin A1c -NovoLog sliding scale -Holding metformin  Essential hypertension -Holding lisinopril HCTZ in the setting of soft blood  pressure  Hyperlipidemia -Restart statin  Depression -Continue fluoxetine  History of breast cancer -Patient finished 5 years of antiestrogen therapy December 2019    Disposition Plan:   Home 1-2 days Family Communication:  No Family at bedside  Consultants:  none  Code Status:  FULL  DVT Prophylaxis:  Pyote Lovenox   Procedures: As Listed in Progress Note Above  Antibiotics: None       Subjective: Patient denies fevers, chills, headache, chest pain, dyspnea, nausea, vomiting, diarrhea, abdominal pain, dysuria, hematuria, hematochezia, and melena.   Objective: Vitals:   09/01/18 1912 09/01/18 2000 09/01/18 2056 09/02/18 0559  BP: (!) 86/48 (!) 105/58 110/60 (!) 90/49  Pulse: 73 77 73 68  Resp: 20 20 15 17   Temp:   98.5 F (36.9 C) 98.2 F (36.8 C)  TempSrc:   Oral Oral  SpO2: 91% 100% 100% 95%  Weight:   100.1 kg   Height:   5\' 6"  (1.676 m)     Intake/Output Summary (Last 24 hours) at 09/02/2018 0736 Last data filed at 09/02/2018 0600 Gross per 24 hour  Intake 1426.54 ml  Output --  Net 1426.54 ml   Weight change:  Exam:   General:  Pt is alert, follows commands appropriately, not in acute distress  HEENT: No icterus, No thrush, No neck mass, [place]/AT  Cardiovascular: RRR, S1/S2, no rubs, no gallops  Respiratory: CTA bilaterally, no wheezing, no crackles, no rhonchi  Abdomen: Soft/+BS, non tender, non distended, no guarding  Extremities: No edema, No lymphangitis, No petechiae, No rashes, no synovitis   Data Reviewed: I have personally reviewed following labs and imaging studies Basic Metabolic Panel: Recent Labs  Lab 09/01/18 1647 09/02/18 0600  NA 139 139  K 4.0 4.1  CL 101 104  CO2 25 24  GLUCOSE 159* 113*  BUN 33* 42*  CREATININE 1.66* 2.09*  CALCIUM 9.3 8.4*   Liver Function Tests: Recent Labs  Lab 09/01/18 1647  AST 30  ALT 22  ALKPHOS 41  BILITOT 0.7  PROT 6.8  ALBUMIN 3.8   No results for input(s): LIPASE, AMYLASE  in the last 168 hours. No results for input(s): AMMONIA in the last 168 hours. Coagulation Profile: No results for input(s): INR, PROTIME in the last 168 hours. CBC: Recent Labs  Lab 09/01/18 1647 09/02/18 0600  WBC 15.1* 8.1  NEUTROABS 12.4*  --   HGB 12.3 10.9*  HCT 38.6 34.1*  MCV 91.9 92.2  PLT 212 174   Cardiac Enzymes: No results for input(s): CKTOTAL, CKMB, CKMBINDEX, TROPONINI in the last 168 hours. BNP: Invalid input(s): POCBNP CBG: Recent Labs  Lab 09/01/18 2103 09/02/18 0720  GLUCAP 108* 103*   HbA1C: No results for input(s): HGBA1C in the last 72 hours. Urine analysis: No results found for: COLORURINE, APPEARANCEUR, LABSPEC, PHURINE, GLUCOSEU, HGBUR, BILIRUBINUR, KETONESUR, PROTEINUR, UROBILINOGEN, NITRITE, LEUKOCYTESUR Sepsis Labs: @LABRCNTIP (procalcitonin:4,lacticidven:4) ) Recent Results (from the past 240 hour(s))  SARS Coronavirus 2 (CEPHEID - Performed in [place] hospital lab), Hosp Order     Status: None   Collection Time: 09/01/18  5:24 PM  Result Value Ref Range Status   SARS Coronavirus 2 NEGATIVE NEGATIVE Final    Comment: (NOTE) If result is NEGATIVE SARS-CoV-2 target nucleic acids are NOT DETECTED. The SARS-CoV-2 RNA is generally detectable in upper and lower  respiratory specimens during the acute phase of infection. The lowest  concentration of SARS-CoV-2 viral copies this assay can detect is 250  copies / mL. A negative result does not preclude SARS-CoV-2 infection  and should not be used as the sole basis for treatment or other  patient management decisions.  A negative result may occur with  improper specimen collection / handling, submission of specimen other  than nasopharyngeal swab, presence of viral mutation(s) within the  areas targeted by this assay, and inadequate number of viral copies  (<250 copies / mL). A negative result must be combined with clinical  observations, patient history, and epidemiological information. If  result is POSITIVE SARS-CoV-2 target nucleic acids are DETECTED. The SARS-CoV-2 RNA is generally detectable in upper and lower  respiratory specimens dur ing the acute phase of infection.  Positive  results are indicative of active infection with SARS-CoV-2.  Clinical  correlation with patient history and other diagnostic information is  necessary to determine patient infection status.  Positive results do  not rule out bacterial infection or co-infection with other viruses. If result is PRESUMPTIVE POSTIVE SARS-CoV-2 nucleic acids MAY BE PRESENT.   A presumptive positive result was obtained on the submitted specimen  and confirmed on repeat testing.  While 2019 novel coronavirus  (SARS-CoV-2) nucleic acids may be present in the submitted sample  additional confirmatory testing may be necessary for epidemiological  and / or clinical management purposes  to differentiate between  SARS-CoV-2 and other Sarbecovirus currently known to infect humans.  If clinically indicated additional testing with an alternate test  methodology [phone]) is advised. The SARS-CoV-2 RNA is generally  detectable in upper and lower respiratory sp ecimens during the acute  phase of infection. The expected result is Negative. Fact Sheet for Patients:  StrictlyIdeas.no Fact Sheet for Healthcare Providers: BankingDealers.co.za This test is not yet approved or cleared by the [place]  FDA and has been authorized for detection and/or diagnosis of SARS-CoV-2 by FDA under an Emergency Use Authorization (EUA).  This EUA will remain in effect (meaning this test can be used) for the duration of the COVID-19 declaration under Section 564(b)(1) of the Act, 21 U.S.C. section 360bbb-3(b)(1), unless the authorization is terminated or revoked sooner. Performed at [facility], [address], [place], [place] [address]      Scheduled Meds:  enoxaparin (LOVENOX) injection   50 mg Subcutaneous Q24H   insulin aspart  0-9 Units Subcutaneous TID WC   Continuous Infusions:  sodium chloride 100 mL/hr at 09/01/18 2238    Procedures/Studies: Dg Chest 2 View  Result Date: 09/01/2018 CLINICAL DATA:  Acute pain following fall from horse today. Initial encounter. EXAM: CHEST - 2 VIEW COMPARISON:  None. FINDINGS: The cardiomediastinal silhouette is unremarkable. There is no evidence of focal airspace disease, pulmonary edema, suspicious pulmonary nodule/mass, pleural effusion, or pneumothorax. No acute bony abnormalities are identified. IMPRESSION: No active cardiopulmonary disease. Electronically Signed   By: Margarette Canada M.D.   On: 09/01/2018 16:47   Dg Lumbar Spine Complete  Result Date: 09/01/2018 CLINICAL DATA:  Fall from horse with low back pain, initial encounter EXAM: LUMBAR SPINE - COMPLETE 4+ VIEW COMPARISON:  None. FINDINGS: Calcified gallstones are noted in the right upper quadrant. Five lumbar type vertebral bodies are well visualized. Body height is well maintained. Multilevel osteophytic changes and disc space narrowing noted. No pars defects are seen. No anterolisthesis is noted. IMPRESSION: Degenerative change without acute abnormality. Cholelithiasis in the upper quadrant. Electronically Signed   By: Inez Catalina M.D.   On: 09/01/2018 16:40   Ct Head Wo Contrast  Result Date: 09/01/2018 CLINICAL DATA:  Fall from horse today.  Posterior head neck pain. EXAM: CT HEAD WITHOUT CONTRAST CT CERVICAL SPINE WITHOUT CONTRAST TECHNIQUE: Multidetector CT imaging of the head and cervical spine was performed following the standard protocol without intravenous contrast. Multiplanar CT image reconstructions of the cervical spine were also generated. COMPARISON:  None. FINDINGS: CT HEAD FINDINGS Brain: The brainstem, cerebellum, cerebral peduncles, thalami, basal ganglia, basilar cisterns, and ventricular system appear within normal limits. No intracranial hemorrhage, mass  lesion, or acute CVA. Vascular: There is atherosclerotic calcification of the cavernous carotid arteries bilaterally. Skull: Unremarkable Sinuses/Orbits: Mild chronic right frontal sinusitis. Other: No supplemental non-categorized findings. CT CERVICAL SPINE FINDINGS Alignment: No vertebral subluxation is observed. Skull base and vertebrae: No fracture or acute bony findings. Mild pannus posterior to the odontoid measuring 4 mm in thickness. Degenerative disc disease with loss of disc height at C4-5, C5-6, and C6-7. Left facet arthropathy especially at C2-3 and C3-4. Soft tissues and spinal canal: Unremarkable Disc levels: Mild foraminal narrowing due to uncinate and facet spurring on the left at C4-5, C5-6, and C6-7; and on the right at C4-5 and C5-6. Suspected mild central narrowing of the thecal sac at C6-7 due to spurring and short pedicles. Upper chest: Unremarkable Other: No supplemental non-categorized findings. IMPRESSION: 1. No acute intracranial findings and no acute cervical spine findings. 2. Atherosclerosis. 3. Mild chronic right frontal sinusitis. 4. Cervical spondylosis and degenerative disc disease with suspected impingement at C4-5, C5-6, and C6-7 due to spurring. Electronically Signed   By: Van Clines M.D.   On: 09/01/2018 16:23   Ct Cervical Spine Wo Contrast  Result Date: 09/01/2018 CLINICAL DATA:  Fall from horse today.  Posterior head neck pain. EXAM: CT HEAD WITHOUT CONTRAST CT CERVICAL SPINE WITHOUT CONTRAST TECHNIQUE: Multidetector  CT imaging of the head and cervical spine was performed following the standard protocol without intravenous contrast. Multiplanar CT image reconstructions of the cervical spine were also generated. COMPARISON:  None. FINDINGS: CT HEAD FINDINGS Brain: The brainstem, cerebellum, cerebral peduncles, thalami, basal ganglia, basilar cisterns, and ventricular system appear within normal limits. No intracranial hemorrhage, mass lesion, or acute CVA. Vascular:  There is atherosclerotic calcification of the cavernous carotid arteries bilaterally. Skull: Unremarkable Sinuses/Orbits: Mild chronic right frontal sinusitis. Other: No supplemental non-categorized findings. CT CERVICAL SPINE FINDINGS Alignment: No vertebral subluxation is observed. Skull base and vertebrae: No fracture or acute bony findings. Mild pannus posterior to the odontoid measuring 4 mm in thickness. Degenerative disc disease with loss of disc height at C4-5, C5-6, and C6-7. Left facet arthropathy especially at C2-3 and C3-4. Soft tissues and spinal canal: Unremarkable Disc levels: Mild foraminal narrowing due to uncinate and facet spurring on the left at C4-5, C5-6, and C6-7; and on the right at C4-5 and C5-6. Suspected mild central narrowing of the thecal sac at C6-7 due to spurring and short pedicles. Upper chest: Unremarkable Other: No supplemental non-categorized findings. IMPRESSION: 1. No acute intracranial findings and no acute cervical spine findings. 2. Atherosclerosis. 3. Mild chronic right frontal sinusitis. 4. Cervical spondylosis and degenerative disc disease with suspected impingement at C4-5, C5-6, and C6-7 due to spurring. Electronically Signed   By: Van Clines M.D.   On: 09/01/2018 16:23   Ct Pelvis Wo Contrast  Result Date: 09/01/2018 CLINICAL DATA:  Recent pelvic trauma with no known pubic rami fractures, evaluate for additional fractures. EXAM: CT PELVIS WITHOUT CONTRAST TECHNIQUE: Multidetector CT imaging of the pelvis was performed following the standard protocol without intravenous contrast. COMPARISON:  None. FINDINGS: Urinary Tract:  Bladder is decompressed. Bowel:  Visualized large and small bowel is within normal limits. Vascular/Lymphatic: Vascular calcifications are seen without aneurysmal dilatation. No significant lymphadenopathy is noted. Reproductive:  Uterus has been surgically removed. Other:  No free fluid is noted.  No soft tissue abnormality is seen.  Musculoskeletal: Degenerative changes of lumbar spine are noted. Mildly displaced fracture through the right sacral ala is seen no definitive left sacral fracture is seen. Minimally displaced superior and inferior pubic rami fractures are noted bilaterally. No focal hematoma is noted. No proximal femoral fractures are seen. No acetabular involvement is seen. IMPRESSION: Superior and inferior pubic rami fractures bilaterally as well as mildly displaced right sacral fracture. Electronically Signed   By: Inez Catalina M.D.   On: 09/01/2018 18:20   Dg Hip Unilat W Or Wo Pelvis 2-3 Views Right  Result Date: 09/01/2018 CLINICAL DATA:  73 year old female with fall and right hip pain EXAM: DG HIP (WITH OR WITHOUT PELVIS) 2-3V RIGHT COMPARISON:  None. FINDINGS: Bilateral superior and inferior pubic rami fractures, minimally displaced. Bilateral hips projects normally over the acetabula. No acute proximal femur fracture identified on the left or the right. Degenerative changes of the bilateral hips. IMPRESSION: Bilateral minimally displaced fractures of the superior and inferior pubic ramus. Electronically Signed   By: Corrie Mckusick D.O.   On: 09/01/2018 16:40    Orson Eva, DO  [facility] Pager [phone]  If 7PM-7AM, please contact night-coverage [email or web address] Password [email or web address] 09/02/2018, 7:36 AM   LOS: 0 days

## 2018-09-02 NOTE — Evaluation (Signed)
Physical Therapy Evaluation Patient Details Name: Deborah Warner MRN: [id number] DOB: 16-Jan-1946 Today's Date: 09/02/2018   History of Present Illness  Deborah Warner is a 73 y.o. female with a history of endometrial cancer, diabetes, hypertension, hyperlipidemia.  Patient seen after a fall off her horse and landing on her buttocks.  Patient had immediate pain that was nonradiating and quite severe.  Worse with movement and improved with lying still.  CT shows superior and inferior pubic rami fractures bilaterally as well as mildly displaced right sacral fracture.   Clinical Impression   Deborah Warner is a 73 y.o. presenting for PT evaluation with recent decrease in functional mobility secondary to admission following a fall from her horse resulting in bilateral inferior pubic rami fractures. Per MD note on 09/01/2018 pt is WBAT for Bil LE. She is currently functioning below her baseline of independent with no device, and now requires moderate assist for bed mobility and min to mod assist for sit to stand transfers. Patient was limited today by painand unable to perform gait or take small steps to Deborah Warner with use of RW. She became lightheaded in standing after 1-2 minutes and was assisted back to sitting and lying supine. Patient reported symptoms resolved once lying supine. Deborah Warner will benefit from skilled PT to address current impairments and from follow up PT at below venue to address mobility and pain management needs. Acute PT will follow.     Follow Up Recommendations SNF    Equipment Recommendations  Wheelchair (measurements PT);Wheelchair cushion (measurements PT)    Recommendations for Other Services       Precautions / Restrictions Precautions Precautions: Fall Restrictions Weight Bearing Restrictions: Yes RLE Weight Bearing: Weight bearing as tolerated LLE Weight Bearing: Weight bearing as tolerated Other Position/Activity Restrictions: WBAT per MD Note: Dr. Lyndel Pleasure      Mobility  Bed Mobility Overal bed mobility: Needs Assistance Bed Mobility: Rolling;Supine to Sit;Sit to Supine Rolling: Mod assist   Supine to sit: Mod assist Sit to supine: Mod assist   General bed mobility comments: cues for use of bed rails and for LE positioning as well as rolling trunk  Transfers Overall transfer level: Needs assistance Equipment used: Rolling walker (2 wheeled) Transfers: Sit to/from Stand Sit to Stand: Min assist         General transfer comment: sit to stand elevated EOB with RW, pt ok with initial standing and no major increase in pain  Ambulation/Gait Ambulation/Gait assistance: Max assist;Total assist Gait Distance (Feet): 0 Feet Assistive device: Rolling walker (2 wheeled) Gait Pattern/deviations: Antalgic     General Gait Details: pt attempting steps at EOB with RW for support, unable to progress forward or laterally with steps but performed 2 steps bil LE in place      Balance Overall balance assessment: Needs assistance Sitting-balance support: Bilateral upper extremity supported;Feet unsupported Sitting balance-Leahy Scale: Poor Sitting balance - Comments: pt requires UE support to maintain balance Postural control: Posterior lean   Standing balance-Leahy Scale: Poor Standing balance comment: pt relies on RW for support in standing        Pertinent Vitals/Pain Pain Assessment: No/denies pain(none at start of sesion. increased throughout to 81/10)    Home Living Family/patient expects to be discharged to:: Private residence Living Arrangements: Other relatives(pt's niece) Available Help at Discharge: Family Type of Home: House Home Access: Level entry     Home Layout: One level Home Equipment: Crutches;Bedside commode;Walker - 2 wheels  Prior Function Level of Independence: Independent            Extremity/Trunk Assessment   Upper Extremity Assessment Upper Extremity Assessment: Overall WFL for  tasks assessed    Lower Extremity Assessment Lower Extremity Assessment: RLE deficits/detail;LLE deficits/detail RLE Deficits / Details: pt is limited by pain with functional tasks but overall has 4/5 testing with knee extension and ankle dorsiflexion RLE: Unable to fully assess due to pain RLE Sensation: WNL RLE Coordination: WNL LLE: Unable to fully assess due to pain LLE Sensation: WNL LLE Coordination: WNL    Cervical / Trunk Assessment Cervical / Trunk Assessment: Normal  Communication   Communication: No difficulties  Cognition Arousal/Alertness: Awake/alert Behavior During Therapy: WFL for tasks assessed/performed Overall Cognitive Status: Within Functional Limits for tasks assessed      General Comments: after standing for ~ 1-2 minutes patient became light headed and sleppy/confused, this resolved after laying down.       General Comments      Exercises Other Exercises Other Exercises: ankle pumps while sitting at EOB for 1 minute   Assessment/Plan    PT Assessment Patient needs continued PT services  PT Problem List Pain;Decreased range of motion;Decreased activity tolerance;Decreased balance;Decreased mobility;Decreased knowledge of use of DME       PT Treatment Interventions DME instruction;Therapeutic exercise;Wheelchair mobility training;Gait training;Balance training;Stair training;Modalities;Functional mobility training;Therapeutic activities;Patient/family education    PT Goals (Current goals can be found in the Care Plan section)  Acute Rehab PT Goals Patient Stated Goal: to be able to walk and get back on her hores. to get strong enough to go home PT Goal Formulation: With patient Time For Goal Achievement: 09/16/18    Frequency Min 5X/week    AM-PAC PT "6 Clicks" Mobility  Outcome Measure Help needed turning from your back to your side while in a flat bed without using bedrails?: A Lot Help needed moving from lying on your back to sitting on  the side of a flat bed without using bedrails?: A Lot Help needed moving to and from a bed to a chair (including a wheelchair)?: Total Help needed standing up from a chair using your arms (e.g., wheelchair or bedside chair)?: A Little Help needed to walk in hospital room?: Total Help needed climbing 3-5 steps with a railing? : Total 6 Click Score: 10    End of Session Equipment Utilized During Treatment: Gait belt(RW) Activity Tolerance: Patient limited by pain Patient left: in bed;with call bell/phone within reach;with bed alarm set Nurse Communication: Mobility status PT Visit Diagnosis: Unsteadiness on feet (R26.81);Difficulty in walking, not elsewhere classified (R26.2);Other abnormalities of gait and mobility (R26.89);Pain Pain - Right/Left: (Right and Left) Pain - part of body: Hip    Time: 3295-1884 PT Time Calculation (min) (ACUTE ONLY): 32 min   Charges:   PT Evaluation $PT Eval Low Complexity: 1 Low PT Treatments $Therapeutic Activity: 8-22 mins       Kipp Brood, PT, DPT, [facility] Physical Therapist with [facility]  09/02/2018 11:59 AM

## 2018-09-02 NOTE — Discharge Summary (Signed)
Physician Discharge Summary  Deborah Warner AQT:[id number] DOB: 1945-05-20 DOA: 09/01/2018  PCP: System, Pcp Not In  Admit date: 09/01/2018 Discharge date: 09/03/2018  Admitted From: Home Disposition:  SNF  Recommendations for Outpatient Follow-up:  1. Follow up with PCP in 1-2 weeks 2. Please obtain BMP/CBC in one week    Discharge Condition: Stable CODE STATUS: FULL Diet recommendation: Heart Healthy / Carb Modified   Brief/Interim Summary: 73 year old female with a history of diabetes mellitus, hyperlipidemia, hypertension, endometrial carcinoma, right-sided breast cancer presenting with a fall.  The patient was at a horse show in [place] when she was thrown off her horse landing on her buttocks.  She hit her head also but did not lose consciousness.  She had immediate pain and had difficulty bearing weight.  She was brought to emergency department for further evaluation.  CT of the pelvis showed a superior and inferior pubic rami fracture bilateral.  There is also a mildly displaced right sacral fracture.  CT of the brain was negative for any acute findings.  CT of the cervical spine show degenerative disc disease with impingement of C4-5, C5-6, C6-7.  BMP, LFTs, and CBC were essentially unremarkable except for WBC 15.1.  The patient was afebrile hemodynamically stable.  The patient did have a drop in her blood pressure and oxygen desaturation with intravenous opioids. The patient had been in her usual state of health prior to the current situation. Patient denies fevers, chills, headache, chest pain, dyspnea, nausea, vomiting, diarrhea, abdominal pain, dysuria, hematuria, hematochezia, and melena.   Discharge Diagnoses:  Bilateral superior and inferior pubic rami fracture/sacral fracture -Nonoperative management per orthopedics -PT evaluation-->SNF -Judicious opioids -Start MiraLAX  Acute on chronic renal failure--CKD stage III -Baseline creatinine 1.2-1.4 (from 2015) -pt  likely has had progression of underlying CKD -d/c ibuprofen, lisinopril/HCTZ -due to hemodynamic changes and NSAID -renal US--no hydronephrosis -serum creatinine peaked 2.09 -serum creatinine 1.17 on day of d/c  Diabetes mellitus type 2 -Hemoglobin A1c--6.2 -NovoLog sliding scale-->discontinue--CBGs well controlled -Holding metformin during the hospitalization  Essential hypertension -Holding lisinopril HCTZ in the setting of soft blood pressure -BP remains well controlled off meds -remains off for now and monitor clinically  Hyperlipidemia -Restart statin  Depression -Continue fluoxetine  History of breast cancer -Patient finished 5 years of antiestrogen therapy December 2019    Discharge Instructions   Allergies as of 09/03/2018      Reactions   Augmentin [amoxicillin-pot Clavulanate] Hives   Codeine Palpitations      Medication List    STOP taking these medications   lisinopril-hydrochlorothiazide 20-12.5 MG tablet Commonly known as:  ZESTORETIC     TAKE these medications   aspirin EC 81 MG tablet Take 81 mg by mouth every morning.   cetirizine 10 MG tablet Commonly known as:  ZYRTEC Take 10 mg by mouth at bedtime.   FLUoxetine 40 MG capsule Commonly known as:  PROZAC Take 40 mg by mouth every morning.   metFORMIN 500 MG tablet Commonly known as:  GLUCOPHAGE Take 500 mg by mouth 2 (two) times daily.   oxyCODONE-acetaminophen 5-325 MG tablet Commonly known as:  PERCOCET/ROXICET Take 1 tablet by mouth every 4 (four) hours as needed for moderate pain.   pravastatin 80 MG tablet Commonly known as:  PRAVACHOL Take 80 mg by mouth every evening.      Follow-up Information    Nicholes Stairs, MD Follow up in 2 week(s).   Specialty:  Orthopedic Surgery Contact information: [address] [place] [address] [phone]          Allergies  Allergen Reactions   Augmentin [Amoxicillin-Pot Clavulanate] Hives    Codeine Palpitations    Consultations:  ortho   Procedures/Studies: Dg Chest 2 View  Result Date: 09/01/2018 CLINICAL DATA:  Acute pain following fall from horse today. Initial encounter. EXAM: CHEST - 2 VIEW COMPARISON:  None. FINDINGS: The cardiomediastinal silhouette is unremarkable. There is no evidence of focal airspace disease, pulmonary edema, suspicious pulmonary nodule/mass, pleural effusion, or pneumothorax. No acute bony abnormalities are identified. IMPRESSION: No active cardiopulmonary disease. Electronically Signed   By: Margarette Canada M.D.   On: 09/01/2018 16:47   Dg Lumbar Spine Complete  Result Date: 09/01/2018 CLINICAL DATA:  Fall from horse with low back pain, initial encounter EXAM: LUMBAR SPINE - COMPLETE 4+ VIEW COMPARISON:  None. FINDINGS: Calcified gallstones are noted in the right upper quadrant. Five lumbar type vertebral bodies are well visualized. Body height is well maintained. Multilevel osteophytic changes and disc space narrowing noted. No pars defects are seen. No anterolisthesis is noted. IMPRESSION: Degenerative change without acute abnormality. Cholelithiasis in the upper quadrant. Electronically Signed   By: Inez Catalina M.D.   On: 09/01/2018 16:40   Ct Head Wo Contrast  Result Date: 09/01/2018 CLINICAL DATA:  Fall from horse today.  Posterior head neck pain. EXAM: CT HEAD WITHOUT CONTRAST CT CERVICAL SPINE WITHOUT CONTRAST TECHNIQUE: Multidetector CT imaging of the head and cervical spine was performed following the standard protocol without intravenous contrast. Multiplanar CT image reconstructions of the cervical spine were also generated. COMPARISON:  None. FINDINGS: CT HEAD FINDINGS Brain: The brainstem, cerebellum, cerebral peduncles, thalami, basal ganglia, basilar cisterns, and ventricular system appear within normal limits. No intracranial hemorrhage, mass lesion, or acute CVA. Vascular: There is atherosclerotic calcification of the cavernous carotid  arteries bilaterally. Skull: Unremarkable Sinuses/Orbits: Mild chronic right frontal sinusitis. Other: No supplemental non-categorized findings. CT CERVICAL SPINE FINDINGS Alignment: No vertebral subluxation is observed. Skull base and vertebrae: No fracture or acute bony findings. Mild pannus posterior to the odontoid measuring 4 mm in thickness. Degenerative disc disease with loss of disc height at C4-5, C5-6, and C6-7. Left facet arthropathy especially at C2-3 and C3-4. Soft tissues and spinal canal: Unremarkable Disc levels: Mild foraminal narrowing due to uncinate and facet spurring on the left at C4-5, C5-6, and C6-7; and on the right at C4-5 and C5-6. Suspected mild central narrowing of the thecal sac at C6-7 due to spurring and short pedicles. Upper chest: Unremarkable Other: No supplemental non-categorized findings. IMPRESSION: 1. No acute intracranial findings and no acute cervical spine findings. 2. Atherosclerosis. 3. Mild chronic right frontal sinusitis. 4. Cervical spondylosis and degenerative disc disease with suspected impingement at C4-5, C5-6, and C6-7 due to spurring. Electronically Signed   By: Van Clines M.D.   On: 09/01/2018 16:23   Ct Cervical Spine Wo Contrast  Result Date: 09/01/2018 CLINICAL DATA:  Fall from horse today.  Posterior head neck pain. EXAM: CT HEAD WITHOUT CONTRAST CT CERVICAL SPINE WITHOUT CONTRAST TECHNIQUE: Multidetector CT imaging of the head and cervical spine was performed following the standard protocol without intravenous contrast. Multiplanar CT image reconstructions of the cervical spine were also generated. COMPARISON:  None. FINDINGS: CT HEAD FINDINGS Brain: The brainstem, cerebellum, cerebral peduncles, thalami, basal ganglia, basilar cisterns, and ventricular system appear within normal limits. No intracranial hemorrhage, mass lesion, or acute CVA. Vascular: There is atherosclerotic calcification of the cavernous carotid arteries bilaterally. Skull:  Unremarkable Sinuses/Orbits: Mild chronic right frontal sinusitis. Other: No supplemental non-categorized findings. CT CERVICAL SPINE FINDINGS Alignment: No vertebral subluxation is observed. Skull base and vertebrae: No fracture or acute bony findings. Mild pannus posterior to the odontoid measuring 4 mm in thickness. Degenerative disc disease with loss of disc height at C4-5, C5-6, and C6-7. Left facet arthropathy especially at C2-3 and C3-4. Soft tissues and spinal canal: Unremarkable Disc levels: Mild foraminal narrowing due to uncinate and facet spurring on the left at C4-5, C5-6, and C6-7; and on the right at C4-5 and C5-6. Suspected mild central narrowing of the thecal sac at C6-7 due to spurring and short pedicles. Upper chest: Unremarkable Other: No supplemental non-categorized findings. IMPRESSION: 1. No acute intracranial findings and no acute cervical spine findings. 2. Atherosclerosis. 3. Mild chronic right frontal sinusitis. 4. Cervical spondylosis and degenerative disc disease with suspected impingement at C4-5, C5-6, and C6-7 due to spurring. Electronically Signed   By: Van Clines M.D.   On: 09/01/2018 16:23   Ct Pelvis Wo Contrast  Result Date: 09/01/2018 CLINICAL DATA:  Recent pelvic trauma with no known pubic rami fractures, evaluate for additional fractures. EXAM: CT PELVIS WITHOUT CONTRAST TECHNIQUE: Multidetector CT imaging of the pelvis was performed following the standard protocol without intravenous contrast. COMPARISON:  None. FINDINGS: Urinary Tract:  Bladder is decompressed. Bowel:  Visualized large and small bowel is within normal limits. Vascular/Lymphatic: Vascular calcifications are seen without aneurysmal dilatation. No significant lymphadenopathy is noted. Reproductive:  Uterus has been surgically removed. Other:  No free fluid is noted.  No soft tissue abnormality is seen. Musculoskeletal: Degenerative changes of lumbar spine are noted. Mildly displaced fracture through  the right sacral ala is seen no definitive left sacral fracture is seen. Minimally displaced superior and inferior pubic rami fractures are noted bilaterally. No focal hematoma is noted. No proximal femoral fractures are seen. No acetabular involvement is seen. IMPRESSION: Superior and inferior pubic rami fractures bilaterally as well as mildly displaced right sacral fracture. Electronically Signed   By: Inez Catalina M.D.   On: 09/01/2018 18:20   US Renal  Result Date: 09/02/2018 CLINICAL DATA:  Acute renal disease.  Stage III. EXAM: RENAL / URINARY TRACT ULTRASOUND COMPLETE COMPARISON:  None. FINDINGS: Right Kidney: Renal measurements: 11.6 x 5.5 x 6.3 cm = volume: 210 mL. Several shadowing calculi. No evidence obstruction. Left Kidney: Renal measurements: 12.2 x 7.3 x 6.3 cm = volume: 285 mL. Echogenicity within normal limits. No mass or hydronephrosis visualized. Bladder: Distended bladder. IMPRESSION: 1. No hydronephrosis. 2. Nonobstructing RIGHT renal calculi. 3. Distended bladder. Electronically Signed   By: Suzy Bouchard M.D.   On: 09/02/2018 11:46   Dg Hip Unilat W Or Wo Pelvis 2-3 Views Right  Result Date: 09/01/2018 CLINICAL DATA:  73 year old female with fall and right hip pain EXAM: DG HIP (WITH OR WITHOUT PELVIS) 2-3V RIGHT COMPARISON:  None. FINDINGS: Bilateral superior and inferior pubic rami fractures, minimally displaced. Bilateral hips projects normally over the acetabula. No acute proximal femur fracture identified on the left or the right. Degenerative changes of the bilateral hips. IMPRESSION: Bilateral minimally displaced fractures of the superior and inferior pubic ramus. Electronically Signed   By: Corrie Mckusick D.O.   On: 09/01/2018 16:40        Discharge Exam: Vitals:   09/02/18 2119 09/03/18 0619  BP: (!) 105/53 (!) 119/54  Pulse: 72 74  Resp: 16 18  Temp: 99.2 F (37.3 C) 99.5 F (37.5 C)  SpO2:  98% 94%   Vitals:   09/02/18 1428 09/02/18 1759 09/02/18 2119  09/03/18 0619  BP: (!) 84/59 120/70 (!) 105/53 (!) 119/54  Pulse: 72  72 74  Resp: 17  16 18   Temp: 98 F (36.7 C)  99.2 F (37.3 C) 99.5 F (37.5 C)  TempSrc: Oral  Oral Oral  SpO2: 96%  98% 94%  Weight:      Height:        General: Pt is alert, awake, not in acute distress Cardiovascular: RRR, S1/S2 +, no rubs, no gallops Respiratory: CTA bilaterally, no wheezing, no rhonchi Abdominal: Soft, NT, ND, bowel sounds + Extremities: no edema, no cyanosis   The results of significant diagnostics from this hospitalization (including imaging, microbiology, ancillary and laboratory) are listed below for reference.    Significant Diagnostic Studies: Dg Chest 2 View  Result Date: 09/01/2018 CLINICAL DATA:  Acute pain following fall from horse today. Initial encounter. EXAM: CHEST - 2 VIEW COMPARISON:  None. FINDINGS: The cardiomediastinal silhouette is unremarkable. There is no evidence of focal airspace disease, pulmonary edema, suspicious pulmonary nodule/mass, pleural effusion, or pneumothorax. No acute bony abnormalities are identified. IMPRESSION: No active cardiopulmonary disease. Electronically Signed   By: Margarette Canada M.D.   On: 09/01/2018 16:47   Dg Lumbar Spine Complete  Result Date: 09/01/2018 CLINICAL DATA:  Fall from horse with low back pain, initial encounter EXAM: LUMBAR SPINE - COMPLETE 4+ VIEW COMPARISON:  None. FINDINGS: Calcified gallstones are noted in the right upper quadrant. Five lumbar type vertebral bodies are well visualized. Body height is well maintained. Multilevel osteophytic changes and disc space narrowing noted. No pars defects are seen. No anterolisthesis is noted. IMPRESSION: Degenerative change without acute abnormality. Cholelithiasis in the upper quadrant. Electronically Signed   By: Inez Catalina M.D.   On: 09/01/2018 16:40   Ct Head Wo Contrast  Result Date: 09/01/2018 CLINICAL DATA:  Fall from horse today.  Posterior head neck pain. EXAM: CT HEAD WITHOUT  CONTRAST CT CERVICAL SPINE WITHOUT CONTRAST TECHNIQUE: Multidetector CT imaging of the head and cervical spine was performed following the standard protocol without intravenous contrast. Multiplanar CT image reconstructions of the cervical spine were also generated. COMPARISON:  None. FINDINGS: CT HEAD FINDINGS Brain: The brainstem, cerebellum, cerebral peduncles, thalami, basal ganglia, basilar cisterns, and ventricular system appear within normal limits. No intracranial hemorrhage, mass lesion, or acute CVA. Vascular: There is atherosclerotic calcification of the cavernous carotid arteries bilaterally. Skull: Unremarkable Sinuses/Orbits: Mild chronic right frontal sinusitis. Other: No supplemental non-categorized findings. CT CERVICAL SPINE FINDINGS Alignment: No vertebral subluxation is observed. Skull base and vertebrae: No fracture or acute bony findings. Mild pannus posterior to the odontoid measuring 4 mm in thickness. Degenerative disc disease with loss of disc height at C4-5, C5-6, and C6-7. Left facet arthropathy especially at C2-3 and C3-4. Soft tissues and spinal canal: Unremarkable Disc levels: Mild foraminal narrowing due to uncinate and facet spurring on the left at C4-5, C5-6, and C6-7; and on the right at C4-5 and C5-6. Suspected mild central narrowing of the thecal sac at C6-7 due to spurring and short pedicles. Upper chest: Unremarkable Other: No supplemental non-categorized findings. IMPRESSION: 1. No acute intracranial findings and no acute cervical spine findings. 2. Atherosclerosis. 3. Mild chronic right frontal sinusitis. 4. Cervical spondylosis and degenerative disc disease with suspected impingement at C4-5, C5-6, and C6-7 due to spurring. Electronically Signed   By: Van Clines M.D.   On: 09/01/2018 16:23   Ct Cervical  Spine Wo Contrast  Result Date: 09/01/2018 CLINICAL DATA:  Fall from horse today.  Posterior head neck pain. EXAM: CT HEAD WITHOUT CONTRAST CT CERVICAL SPINE  WITHOUT CONTRAST TECHNIQUE: Multidetector CT imaging of the head and cervical spine was performed following the standard protocol without intravenous contrast. Multiplanar CT image reconstructions of the cervical spine were also generated. COMPARISON:  None. FINDINGS: CT HEAD FINDINGS Brain: The brainstem, cerebellum, cerebral peduncles, thalami, basal ganglia, basilar cisterns, and ventricular system appear within normal limits. No intracranial hemorrhage, mass lesion, or acute CVA. Vascular: There is atherosclerotic calcification of the cavernous carotid arteries bilaterally. Skull: Unremarkable Sinuses/Orbits: Mild chronic right frontal sinusitis. Other: No supplemental non-categorized findings. CT CERVICAL SPINE FINDINGS Alignment: No vertebral subluxation is observed. Skull base and vertebrae: No fracture or acute bony findings. Mild pannus posterior to the odontoid measuring 4 mm in thickness. Degenerative disc disease with loss of disc height at C4-5, C5-6, and C6-7. Left facet arthropathy especially at C2-3 and C3-4. Soft tissues and spinal canal: Unremarkable Disc levels: Mild foraminal narrowing due to uncinate and facet spurring on the left at C4-5, C5-6, and C6-7; and on the right at C4-5 and C5-6. Suspected mild central narrowing of the thecal sac at C6-7 due to spurring and short pedicles. Upper chest: Unremarkable Other: No supplemental non-categorized findings. IMPRESSION: 1. No acute intracranial findings and no acute cervical spine findings. 2. Atherosclerosis. 3. Mild chronic right frontal sinusitis. 4. Cervical spondylosis and degenerative disc disease with suspected impingement at C4-5, C5-6, and C6-7 due to spurring. Electronically Signed   By: Van Clines M.D.   On: 09/01/2018 16:23   Ct Pelvis Wo Contrast  Result Date: 09/01/2018 CLINICAL DATA:  Recent pelvic trauma with no known pubic rami fractures, evaluate for additional fractures. EXAM: CT PELVIS WITHOUT CONTRAST TECHNIQUE:  Multidetector CT imaging of the pelvis was performed following the standard protocol without intravenous contrast. COMPARISON:  None. FINDINGS: Urinary Tract:  Bladder is decompressed. Bowel:  Visualized large and small bowel is within normal limits. Vascular/Lymphatic: Vascular calcifications are seen without aneurysmal dilatation. No significant lymphadenopathy is noted. Reproductive:  Uterus has been surgically removed. Other:  No free fluid is noted.  No soft tissue abnormality is seen. Musculoskeletal: Degenerative changes of lumbar spine are noted. Mildly displaced fracture through the right sacral ala is seen no definitive left sacral fracture is seen. Minimally displaced superior and inferior pubic rami fractures are noted bilaterally. No focal hematoma is noted. No proximal femoral fractures are seen. No acetabular involvement is seen. IMPRESSION: Superior and inferior pubic rami fractures bilaterally as well as mildly displaced right sacral fracture. Electronically Signed   By: Inez Catalina M.D.   On: 09/01/2018 18:20   US Renal  Result Date: 09/02/2018 CLINICAL DATA:  Acute renal disease.  Stage III. EXAM: RENAL / URINARY TRACT ULTRASOUND COMPLETE COMPARISON:  None. FINDINGS: Right Kidney: Renal measurements: 11.6 x 5.5 x 6.3 cm = volume: 210 mL. Several shadowing calculi. No evidence obstruction. Left Kidney: Renal measurements: 12.2 x 7.3 x 6.3 cm = volume: 285 mL. Echogenicity within normal limits. No mass or hydronephrosis visualized. Bladder: Distended bladder. IMPRESSION: 1. No hydronephrosis. 2. Nonobstructing RIGHT renal calculi. 3. Distended bladder. Electronically Signed   By: Suzy Bouchard M.D.   On: 09/02/2018 11:46   Dg Hip Unilat W Or Wo Pelvis 2-3 Views Right  Result Date: 09/01/2018 CLINICAL DATA:  73 year old female with fall and right hip pain EXAM: DG HIP (WITH OR WITHOUT PELVIS) 2-3V RIGHT COMPARISON:  None. FINDINGS: Bilateral superior and inferior pubic rami fractures,  minimally displaced. Bilateral hips projects normally over the acetabula. No acute proximal femur fracture identified on the left or the right. Degenerative changes of the bilateral hips. IMPRESSION: Bilateral minimally displaced fractures of the superior and inferior pubic ramus. Electronically Signed   By: Corrie Mckusick D.O.   On: 09/01/2018 16:40     Microbiology: Recent Results (from the past 240 hour(s))  SARS Coronavirus 2 (CEPHEID - Performed in [facility] hospital lab), Hosp Order     Status: None   Collection Time: 09/01/18  5:24 PM  Result Value Ref Range Status   SARS Coronavirus 2 NEGATIVE NEGATIVE Final    Comment: (NOTE) If result is NEGATIVE SARS-CoV-2 target nucleic acids are NOT DETECTED. The SARS-CoV-2 RNA is generally detectable in upper and lower  respiratory specimens during the acute phase of infection. The lowest  concentration of SARS-CoV-2 viral copies this assay can detect is 250  copies / mL. A negative result does not preclude SARS-CoV-2 infection  and should not be used as the sole basis for treatment or other  patient management decisions.  A negative result may occur with  improper specimen collection / handling, submission of specimen other  than nasopharyngeal swab, presence of viral mutation(s) within the  areas targeted by this assay, and inadequate number of viral copies  (<250 copies / mL). A negative result must be combined with clinical  observations, patient history, and epidemiological information. If result is POSITIVE SARS-CoV-2 target nucleic acids are DETECTED. The SARS-CoV-2 RNA is generally detectable in upper and lower  respiratory specimens dur ing the acute phase of infection.  Positive  results are indicative of active infection with SARS-CoV-2.  Clinical  correlation with patient history and other diagnostic information is  necessary to determine patient infection status.  Positive results do  not rule out bacterial infection or  co-infection with other viruses. If result is PRESUMPTIVE POSTIVE SARS-CoV-2 nucleic acids MAY BE PRESENT.   A presumptive positive result was obtained on the submitted specimen  and confirmed on repeat testing.  While 2019 novel coronavirus  (SARS-CoV-2) nucleic acids may be present in the submitted sample  additional confirmatory testing may be necessary for epidemiological  and / or clinical management purposes  to differentiate between  SARS-CoV-2 and other Sarbecovirus currently known to infect humans.  If clinically indicated additional testing with an alternate test  methodology [id number]) is advised. The SARS-CoV-2 RNA is generally  detectable in upper and lower respiratory sp ecimens during the acute  phase of infection. The expected result is Negative. Fact Sheet for Patients:  StrictlyIdeas.no Fact Sheet for Healthcare Providers: BankingDealers.co.za This test is not yet approved or cleared by the [place] FDA and has been authorized for detection and/or diagnosis of SARS-CoV-2 by FDA under an Emergency Use Authorization (EUA).  This EUA will remain in effect (meaning this test can be used) for the duration of the COVID-19 declaration under Section 564(b)(1) of the Act, 21 U.S.C. section 360bbb-3(b)(1), unless the authorization is terminated or revoked sooner. Performed at [facility], [address], [place], [place] [address]      Labs: Basic Metabolic Panel: Recent Labs  Lab 09/01/18 1647 09/02/18 0600 09/03/18 0633  NA 139 139 135  K 4.0 4.1 4.4  CL 101 104 104  CO2 25 24 23   GLUCOSE 159* 113* 122*  BUN 33* 42* 35*  CREATININE 1.66* 2.09* 1.17*  CALCIUM 9.3 8.4* 8.4*  Liver Function Tests: Recent Labs  Lab 09/01/18 1647  AST 30  ALT 22  ALKPHOS 41  BILITOT 0.7  PROT 6.8  ALBUMIN 3.8   No results for input(s): LIPASE, AMYLASE in the last 168 hours. No results for input(s): AMMONIA in the last 168  hours. CBC: Recent Labs  Lab 09/01/18 1647 09/02/18 0600  WBC 15.1* 8.1  NEUTROABS 12.4*  --   HGB 12.3 10.9*  HCT 38.6 34.1*  MCV 91.9 92.2  PLT 212 174   Cardiac Enzymes: No results for input(s): CKTOTAL, CKMB, CKMBINDEX, TROPONINI in the last 168 hours. BNP: Invalid input(s): POCBNP CBG: Recent Labs  Lab 09/02/18 1106 09/02/18 1602 09/02/18 2036 09/03/18 0748 09/03/18 1055  GLUCAP 109* 108* 110* 109* 150*    Time coordinating discharge:  36 minutes  Signed:  Orson Eva, DO [facility] Pager: [phone] 09/03/2018, 11:02 AM

## 2018-09-02 NOTE — Plan of Care (Signed)
  Problem: Acute Rehab PT Goals(only PT should resolve) Goal: Pt Will Go Supine/Side To Sit Outcome: Progressing Flowsheets (Taken 09/02/2018 1200) Pt will go Supine/Side to Sit: with modified independence Goal: Pt Will Go Sit To Supine/Side Outcome: Progressing Flowsheets (Taken 09/02/2018 1200) Pt will go Sit to Supine/Side: with modified independence Goal: Patient Will Transfer Sit To/From Stand Outcome: Progressing Flowsheets (Taken 09/02/2018 1200) Patient will transfer sit to/from stand: with supervision Goal: Pt Will Transfer Bed To Chair/Chair To Bed Outcome: Progressing Flowsheets (Taken 09/02/2018 1200) Pt will Transfer Bed to Chair/Chair to Bed: with min assist

## 2018-09-03 ENCOUNTER — Inpatient Hospital Stay
Admission: RE | Admit: 2018-09-03 | Discharge: 2018-09-20 | Disposition: A | Discharge status: Home / Self Care | Payer: Medicare Other | Source: Ambulatory Visit | Attending: Internal Medicine | Admitting: Internal Medicine

## 2018-09-03 DIAGNOSIS — S32591D Other specified fracture of right pubis, subsequent encounter for fracture with routine healing: Secondary | ICD-10-CM | POA: Diagnosis not present

## 2018-09-03 DIAGNOSIS — S32592D Other specified fracture of left pubis, subsequent encounter for fracture with routine healing: Secondary | ICD-10-CM

## 2018-09-03 DIAGNOSIS — N179 Acute kidney failure, unspecified: Secondary | ICD-10-CM | POA: Diagnosis not present

## 2018-09-03 DIAGNOSIS — I10 Essential (primary) hypertension: Secondary | ICD-10-CM | POA: Diagnosis not present

## 2018-09-03 DIAGNOSIS — R509 Fever, unspecified: Principal | ICD-10-CM

## 2018-09-03 DIAGNOSIS — E119 Type 2 diabetes mellitus without complications: Secondary | ICD-10-CM | POA: Diagnosis not present

## 2018-09-03 DIAGNOSIS — S32512A Fracture of superior rim of left pubis, initial encounter for closed fracture: Secondary | ICD-10-CM | POA: Diagnosis not present

## 2018-09-03 LAB — BASIC METABOLIC PANEL
Anion gap: 8 (ref 5–15)
BUN: 35 mg/dL — ABNORMAL HIGH (ref 8–23)
CO2: 23 mmol/L (ref 22–32)
Calcium: 8.4 mg/dL — ABNORMAL LOW (ref 8.9–10.3)
Chloride: 104 mmol/L (ref 98–111)
Creatinine, Ser: 1.17 mg/dL — ABNORMAL HIGH (ref 0.44–1.00)
GFR calc Af Amer: 54 mL/min — ABNORMAL LOW (ref >60)
GFR calc non Af Amer: 47 mL/min — ABNORMAL LOW (ref >60)
Glucose, Bld: 122 mg/dL — ABNORMAL HIGH (ref 70–99)
Potassium: 4.4 mmol/L (ref 3.5–5.1)
Sodium: 135 mmol/L (ref 135–145)

## 2018-09-03 LAB — GLUCOSE, CAPILLARY
Glucose-Capillary: 109 mg/dL — ABNORMAL HIGH (ref 70–99)
Glucose-Capillary: 150 mg/dL — ABNORMAL HIGH (ref 70–99)

## 2018-09-03 MED ORDER — OXYCODONE-ACETAMINOPHEN 5-325 MG PO TABS: 1.0000 | ORAL_TABLET | ORAL | 0 refills | Status: DC | PRN

## 2018-09-03 NOTE — Progress Notes (Signed)
Physical Therapy Treatment Patient Details Name: Deborah Warner MRN: [id number] DOB: 1945/11/10 Today's Date: 09/03/2018    History of Present Illness Deborah Warner is a 73 y.o. female with a history of endometrial cancer, diabetes, hypertension, hyperlipidemia.  Patient seen after a fall off her horse and landing on her buttocks.  Patient had immediate pain that was nonradiating and quite severe.  Worse with movement and improved with lying still.  CT shows superior and inferior pubic rami fractures bilaterally as well as mildly displaced right sacral fracture.    PT Comments    Pt supine in bed and willing to participate with therpay today.  Pt limited by pain, increased with weight bearing.  Pre-medicated to assist with session.  Mod A with bed mobility with cueing for hand rail and trunk rotation to assist with supine to sit, therapist assisted with LE to EOB.  Elevated bed height and cueing for hand placement to assist with pushing to standing.  Used RW for safety with ability to make short labored steps to chair.  EOS pt left in chair with call bell within reach.  Pain scale stayed at 6/10 upon seated, reports increased to 8/10 with WB.     Follow Up Recommendations  SNF     Equipment Recommendations  Wheelchair (measurements PT);Wheelchair cushion (measurements PT)    Recommendations for Other Services       Precautions / Restrictions Precautions Precautions: Fall Restrictions Weight Bearing Restrictions: Yes RLE Weight Bearing: Weight bearing as tolerated LLE Weight Bearing: Weight bearing as tolerated Other Position/Activity Restrictions: WBAT per MD Note: Dr. Lyndel Pleasure    Mobility  Bed Mobility Overal bed mobility: Needs Assistance Bed Mobility: Rolling;Supine to Sit Rolling: Mod assist   Supine to sit: Mod assist     General bed mobility comments: cues for use of bed rails and for LE positioning as well as rolling trunk  Transfers Overall transfer  level: Needs assistance Equipment used: Rolling walker (2 wheeled) Transfers: Sit to/from Stand Sit to Stand: Min assist         General transfer comment: elevated bed and cueing for hand placmenet/body mechanics.  Used RW for safety upon standing  Ambulation/Gait Ambulation/Gait assistance: Mod assist Gait Distance (Feet): 3 Feet Assistive device: Rolling walker (2 wheeled) Gait Pattern/deviations: Decreased step length - right;Decreased step length - left;Trunk flexed     General Gait Details: 5-6 short labored side steps from bed to chair.  Cueing for posture and to stand within walker   Stairs             Wheelchair Mobility    Modified Rankin (Stroke Patients Only)       Balance                                            Cognition Arousal/Alertness: Awake/alert Behavior During Therapy: WFL for tasks assessed/performed Overall Cognitive Status: Within Functional Limits for tasks assessed                                        Exercises      General Comments        Pertinent Vitals/Pain Pain Assessment: 0-10 Pain Score: 6  Pain Location: hip/pelvis Rt>Lt with increased pain with weight bearing Pain Descriptors / Indicators: Discomfort;Sore;Aching  Pain Intervention(s): Premedicated before session;Monitored during session;Limited activity within patient's tolerance;Repositioned    Home Living                      Prior Function            PT Goals (current goals can now be found in the care plan section)      Frequency    Min 5X/week      PT Plan      Co-evaluation              AM-PAC PT "6 Clicks" Mobility   Outcome Measure  Help needed turning from your back to your side while in a flat bed without using bedrails?: A Lot Help needed moving from lying on your back to sitting on the side of a flat bed without using bedrails?: A Lot Help needed moving to and from a bed to a chair  (including a wheelchair)?: A Lot Help needed standing up from a chair using your arms (e.g., wheelchair or bedside chair)?: A Little Help needed to walk in hospital room?: Total Help needed climbing 3-5 steps with a railing? : Total 6 Click Score: 11    End of Session Equipment Utilized During Treatment: Gait belt(RW) Activity Tolerance: Patient limited by pain;Patient tolerated treatment well Patient left: in chair;with call bell/phone within reach Nurse Communication: Mobility status PT Visit Diagnosis: Unsteadiness on feet (R26.81);Difficulty in walking, not elsewhere classified (R26.2);Other abnormalities of gait and mobility (R26.89);Pain Pain - part of body: Hip     Time: 0940-1006 PT Time Calculation (min) (ACUTE ONLY): 26 min  Charges:  $Therapeutic Activity: 23-37 mins                     [address] LPTA; CBIS [phone]   Aldona Lento 09/03/2018, 10:16 AM

## 2018-09-03 NOTE — Plan of Care (Signed)
  Problem: Coping: Goal: Level of anxiety will decrease Outcome: Progressing   Problem: Elimination: Goal: Will not experience complications related to bowel motility Outcome: Progressing Goal: Will not experience complications related to urinary retention Outcome: Progressing   Problem: Safety: Goal: Ability to remain free from injury will improve Outcome: Progressing   Problem: Skin Integrity: Goal: Risk for impaired skin integrity will decrease Outcome: Progressing   

## 2018-09-03 NOTE — TOC Transition Note (Signed)
Transition of Care (TOC) - CM/SW Discharge Note Patient admitted for  Closed Sacral Fracture, Patient lives at home, her niece lives with her. Patient is active, This admission was an horsing riding accident. PT is recommending SNF. Patient agrees, list given, Patient lives in [place], Referrals will be faxed, permission given by patient.        Patient Goals and CMS Choice Patient states their goals for this hospitalization and ongoing recovery are:: Patient wishes to go to SNF CMS Medicare.gov Compare Post Acute Care list provided to:: Patient Choice offered to / list presented to : Patient  Expected Discharge Plan and Services           Expected Discharge Date: 09/03/18                                    Prior Living Arrangements/Services   Lives with:: (Niece)                   Activities of Daily Living Home Assistive Devices/Equipment: Eyeglasses ADL Screening (condition at time of admission) Patient's cognitive ability adequate to safely complete daily activities?: Yes Is the patient deaf or have difficulty hearing?: No Does the patient have difficulty seeing, even when wearing glasses/contacts?: No Does the patient have difficulty concentrating, remembering, or making decisions?: No Patient able to express need for assistance with ADLs?: Yes Does the patient have difficulty dressing or bathing?: No Independently performs ADLs?: Yes (appropriate for developmental age) Does the patient have difficulty walking or climbing stairs?: Yes Weakness of Legs: None Weakness of Arms/Hands: None  Permission Sought/Granted    [facility] [place] [facility]                          Admission diagnosis:  Fall, initial encounter [W19.XXXA] Closed fracture of sacrum, unspecified portion of sacrum, initial encounter ([place]) [S32.10XA] Multiple closed fractures of pelvis with stable disruption of pelvic ring, initial  encounter [facility]) [S32.810A] Patient Active Problem List   Diagnosis Date Noted  . Acute renal failure superimposed on stage 3 chronic kidney disease ([place]) 09/02/2018  . Multiple closed fractures of pelvis with stable disruption of pelvic circle (HCC)   . Sacral fracture, closed ([place]) 09/01/2018  . Bilateral pubic rami fractures ([place]) 09/01/2018  . Essential hypertension 09/01/2018  . Leukocytosis 09/01/2018  . Acute renal injury ([place]) 09/01/2018  . Diabetes mellitus without complication ([place])    PCP:  System, Pcp Not In Pharmacy:   [facility] [address] [place] [address] Phone: [phone] Fax: [phone]         Readmission Risk Interventions No flowsheet data found.   Patient Details  Name: Deborah Warner MRN: [id number] Date of Birth: [birth year]  Transition of Care [facility]) CM/SW Contact:  Boneta Lucks, RN Phone Number: 09/03/2018, 11:19 AM   Clinical Narrative:       Final next level of care: [place]     Patient Goals and CMS Choice Patient states their goals for this hospitalization and ongoing recovery are:: Patient wishes to go to SNF CMS Medicare.gov Compare Post Acute Care list provided to:: Patient Choice offered to / list presented to : Patient  Discharge Placement                Patient  to be transferred to facility by: Family Member                                               Readmission Risk Interventions No flowsheet data found.

## 2018-09-03 NOTE — TOC Transition Note (Signed)
Transition of Care (TOC) - CM/SW Discharge Note CM has not heard back from any of the SNF in [place]. Had a discussion with patient about going to the [facility]. Discussed the long ride to [place] facility, [place] discuss, patient is agree due to her family not being able to visit at any facility. Offered to call family, Patient declined, she had already talked to family.   Patient Details  Name: Deborah Warner MRN: [id number] Date of Birth: 07-19-1945  Transition of Care [facility]) CM/SW Contact:  Boneta Lucks, RN Phone Number: 09/03/2018, 3:18 PM   Clinical Narrative:       Final next level of care: Skilled Nursing Facility Barriers to Discharge: No Barriers Identified   Patient Goals and CMS Choice Patient states their goals for this hospitalization and ongoing recovery are:: Patient wishes to go to SNF CMS Medicare.gov Compare Post Acute Care list provided to:: Patient Choice offered to / list presented to : Patient  Discharge Placement              Patient chooses bed at: [facility] Patient to be transferred to facility by: [facility] Staff Name of family member notified: Notified by patient Patient and family notified of of transfer: 09/03/18  Discharge Plan and Services                                       Readmission Risk Interventions No flowsheet data found.

## 2018-09-03 NOTE — Progress Notes (Signed)
Patient to be transferred to [facility]. Report called and given to Rayne Du at [facility]. All questions were answered and no further questions at this time. Pt in stable condition and in no acute distress at time of discharge. Pt will be transported via [facility] Staff.

## 2018-09-03 NOTE — NC FL2 (Addendum)
[place] LEVEL OF CARE SCREENING TOOL     IDENTIFICATION  Patient Name: Deborah Warner Birthdate: 02-Jul-1945 Sex: female Admission Date (Current Location): 09/01/2018  [facility] and [place] Number:  [employer] and Address:  [facility] [address], [place]      Provider Number: [id number]  Attending Physician Name and Address:  Orson Eva, MD  Relative Name and Phone Number:  Jerlyn Ly   [phone]     Current Level of Care: Hospital Recommended Level of Care: [place] Prior Approval Number:    Date Approved/Denied:   PASRR Number:   [id number]  Discharge Plan: SNF    Current Diagnoses: Patient Active Problem List   Diagnosis Date Noted  . Acute renal failure superimposed on stage 3 chronic kidney disease ([place]) 09/02/2018  . Multiple closed fractures of pelvis with stable disruption of pelvic circle (HCC)   . Sacral fracture, closed ([place]) 09/01/2018  . Bilateral pubic rami fractures ([place]) 09/01/2018  . Essential hypertension 09/01/2018  . Leukocytosis 09/01/2018  . Acute renal injury ([place]) 09/01/2018  . Diabetes mellitus without complication ([place])     Orientation RESPIRATION BLADDER Height & Weight     Self, Time, Situation, Place  Normal Continent Weight: 100.1 kg Height:  5\' 6"  (167.6 cm)  BEHAVIORAL SYMPTOMS/MOOD NEUROLOGICAL BOWEL NUTRITION STATUS      Continent Diet(Carb Modified)  AMBULATORY STATUS COMMUNICATION OF NEEDS Skin   Extensive Assist Verbally Bruising(Lip HIp)                       Personal Care Assistance Level of Assistance  Bathing, Feeding, Dressing Bathing Assistance: Maximum assistance Feeding assistance: Independent Dressing Assistance: Maximum assistance     Functional Limitations Info  Sight, Hearing, Speech Sight Info: Adequate Hearing Info: Adequate Speech Info: Adequate    SPECIAL CARE FACTORS FREQUENCY  PT (By licensed PT)     PT  Frequency: 5 x a week              Contractures Contractures Info: Not present    Additional Factors Info  Code Status, Allergies, Insulin Sliding Scale Code Status Info: FULL Allergies Info: Augmentin and codiene   Insulin Sliding Scale Info: See discharge summary       Current Medications (09/03/2018):  This is the current hospital active medication list Current Facility-Administered Medications  Medication Dose Route Frequency Provider Last Rate Last Dose  . acetaminophen (TYLENOL) tablet 650 mg  650 mg Oral Q6H PRN Tat, David, MD      . enoxaparin (LOVENOX) injection 30 mg  30 mg Subcutaneous Q24H Tat, Shanon Brow, MD   30 mg at 09/02/18 2147  . insulin aspart (novoLOG) injection 0-9 Units  0-9 Units Subcutaneous TID WC Tat, David, MD      . methocarbamol (ROBAXIN) tablet 500 mg  500 mg Oral Q6H PRN Tat, Shanon Brow, MD      . ondansetron [facility]) tablet 4 mg  4 mg Oral Q6H PRN Truett Mainland, DO       Or  . ondansetron [facility]) injection 4 mg  4 mg Intravenous Q6H PRN Truett Mainland, DO      . oxyCODONE-acetaminophen (PERCOCET/ROXICET) 5-325 MG per tablet 1 tablet  1 tablet Oral Q4H PRN Truett Mainland, DO   1 tablet at 09/03/18 0846  . polyethylene glycol (MIRALAX / GLYCOLAX) packet 17 g  17 g Oral Daily Tat, David, MD   17 g at  09/03/18 0846  . pravastatin (PRAVACHOL) tablet 80 mg  80 mg Oral q1800 Orson Eva, MD   80 mg at 09/02/18 1736     Discharge Medications: Please see discharge summary for a list of discharge medications.  Relevant Imaging Results:  Relevant Lab Results:   Additional Information [id number]  Boneta Lucks, RN

## 2018-09-04 ENCOUNTER — Non-Acute Institutional Stay (SKILLED_NURSING_FACILITY): Payer: Medicare Other | Admitting: Adult Health

## 2018-09-04 ENCOUNTER — Encounter: Payer: Self-pay | Admitting: Adult Health

## 2018-09-04 ENCOUNTER — Other Ambulatory Visit: Payer: Self-pay | Admitting: Adult Health

## 2018-09-04 DIAGNOSIS — J3089 Other allergic rhinitis: Secondary | ICD-10-CM | POA: Diagnosis not present

## 2018-09-04 DIAGNOSIS — E785 Hyperlipidemia, unspecified: Secondary | ICD-10-CM

## 2018-09-04 DIAGNOSIS — E1169 Type 2 diabetes mellitus with other specified complication: Secondary | ICD-10-CM

## 2018-09-04 DIAGNOSIS — N183 Chronic kidney disease, stage 3 unspecified: Secondary | ICD-10-CM | POA: Insufficient documentation

## 2018-09-04 DIAGNOSIS — E1122 Type 2 diabetes mellitus with diabetic chronic kidney disease: Secondary | ICD-10-CM

## 2018-09-04 DIAGNOSIS — E1129 Type 2 diabetes mellitus with other diabetic kidney complication: Secondary | ICD-10-CM | POA: Insufficient documentation

## 2018-09-04 DIAGNOSIS — S32810S Multiple fractures of pelvis with stable disruption of pelvic ring, sequela: Secondary | ICD-10-CM

## 2018-09-04 DIAGNOSIS — F32A Depression, unspecified: Secondary | ICD-10-CM

## 2018-09-04 DIAGNOSIS — F329 Major depressive disorder, single episode, unspecified: Secondary | ICD-10-CM

## 2018-09-04 MED ORDER — OXYCODONE-ACETAMINOPHEN 5-325 MG PO TABS: 1.0000 | ORAL_TABLET | ORAL | 0 refills | Status: DC | PRN

## 2018-09-04 NOTE — Progress Notes (Signed)
Location:   [place] Room Number: 159 P Place of Service:  SNF (31)   CODE STATUS: Full Code  Allergies  Allergen Reactions  . Augmentin [Amoxicillin-Pot Clavulanate] Hives  . Codeine Palpitations    Chief Complaint  Patient presents with  . Hospitalization Follow-up    Hospital follow up    HPI:  She is a 73 year old woman who has been hospitalized from 09-01-18 through 09-03-18 after falling off a horse and suffering multiple pelvic fractures. She is here for short term rehab with her goal to return back home. She does have pelvic pain which is being managed with her current regimen. She denies any changes in appetite; no constipation. She will continue to be followed for her chronic illnesses including: diabetes hypertension; depression.   Past Medical History:  Diagnosis Date  . Cancer ([place])    endometrial   . Diabetes mellitus without complication ([place])   . Hyperlipidemia   . Hypertension     Past Surgical History:  Procedure Laterality Date  . ABDOMINAL HYSTERECTOMY    . BREAST SURGERY      Social History   Socioeconomic History  . Marital status: Single    Spouse name: Not on file  . Number of children: Not on file  . Years of education: Not on file  . Highest education level: Not on file  Occupational History  . Not on file  Social Needs  . Financial resource strain: Not on file  . Food insecurity:    Worry: Not on file    Inability: Not on file  . Transportation needs:    Medical: Not on file    Non-medical: Not on file  Tobacco Use  . Smoking status: Never Smoker  . Smokeless tobacco: Never Used  Substance and Sexual Activity  . Alcohol use: Yes    Comment: occ  . Drug use: Never  . Sexual activity: Not on file  Lifestyle  . Physical activity:    Days per week: Not on file    Minutes per session: Not on file  . Stress: Not on file  Relationships  . Social connections:    Talks on phone: Not on file    Gets  together: Not on file    Attends religious service: Not on file    Active member of club or organization: Not on file    Attends meetings of clubs or organizations: Not on file    Relationship status: Not on file  . Intimate partner violence:    Fear of current or ex partner: Not on file    Emotionally abused: Not on file    Physically abused: Not on file    Forced sexual activity: Not on file  Other Topics Concern  . Not on file  Social History Narrative  . Not on file   History reviewed. No pertinent family history.    VITAL SIGNS BP (!) 122/58   Pulse 77   Temp 98.9 F (37.2 C)   Resp 20   Ht 5\' 6"  (1.676 m)   Wt 221 lb 6.4 oz (100.4 kg)   SpO2 97%   BMI 35.73 kg/m   Outpatient Encounter Medications as of 09/04/2018  Medication Sig  . aspirin EC 81 MG tablet Take 81 mg by mouth every morning.  . cetirizine (ZYRTEC) 10 MG tablet Take 10 mg by mouth at bedtime.   Marland Kitchen FLUoxetine (PROZAC) 40 MG capsule Take 40 mg by mouth every  morning.   . metFORMIN (GLUCOPHAGE) 500 MG tablet Take 500 mg by mouth 2 (two) times daily.  . NON FORMULARY Diet Type:  NAS, No concentrated Sweet  . oxyCODONE-acetaminophen (PERCOCET/ROXICET) 5-325 MG tablet Take 1 tablet by mouth every 4 (four) hours as needed for moderate pain.  . pravastatin (PRAVACHOL) 80 MG tablet Take 80 mg by mouth every evening.    No facility-administered encounter medications on file as of 09/04/2018.      SIGNIFICANT DIAGNOSTIC EXAMS  TODAY:   09-01-18: ct of head and cervical spine:  1. No acute intracranial findings and no acute cervical spine findings. 2. Atherosclerosis. 3. Mild chronic right frontal sinusitis. 4. Cervical spondylosis and degenerative disc disease with suspected impingement at C4-5, C5-6, and C6-7 due to spurring.  09-01-18: pelvic x-ray: Bilateral minimally displaced fractures of the superior and inferior pubic ramus.  09-01-18: lumbar spine x-ray: Degenerative change without acute abnormality.  Cholelithiasis in the upper quadrant.  09-01-18: chest x-ray: No active cardiopulmonary disease.  09-01-18: ct of pelvis:Superior and inferior pubic rami fractures bilaterally as well as mildly displaced right sacral fracture.   09-02-18: renal ultrasound:1. No hydronephrosis. 2. Nonobstructing RIGHT renal calculi. 3. Distended bladder.   LABS REVIEWED TODAY:   09-01-18: wbc 15.1; hgb 12.3; hct 38.6; mcv 91.9; plt 212 glucose 159; bun 33; creat 1.66; k+ 4.0; na++ 139; ca 9.3 liver normal albumin 3.8  09-02-18: wbc 8.1; hgb 10.9; hct 34.1; mcv 92.2; plt 174; glucose 113; bun 42; creat 2.09; k+ 4.1; na++ 139; ca 8.4 hgb a1c 6.2  09-03-18: glucose 122; bun 35; creat 1.17; k+ 4.4; na++ 135; ca 8.4   Review of Systems  Constitutional: Negative for malaise/fatigue.  Respiratory: Negative for cough and shortness of breath.   Cardiovascular: Negative for chest pain, palpitations and leg swelling.  Gastrointestinal: Negative for abdominal pain, constipation and heartburn.  Musculoskeletal: Positive for back pain and joint pain. Negative for myalgias.       Has back and pelvic pain   Skin: Negative.   Neurological: Negative for dizziness.  Psychiatric/Behavioral: The patient is not nervous/anxious.     Physical Exam Constitutional:      General: She is not in acute distress.    Appearance: She is well-developed. She is obese. She is not diaphoretic.  Neck:     Musculoskeletal: Neck supple.     Thyroid: No thyromegaly.  Cardiovascular:     Rate and Rhythm: Normal rate and regular rhythm.     Pulses: Normal pulses.     Heart sounds: Normal heart sounds.  Pulmonary:     Effort: Pulmonary effort is normal. No respiratory distress.     Breath sounds: Normal breath sounds.  Abdominal:     General: Bowel sounds are normal. There is no distension.     Palpations: Abdomen is soft.     Tenderness: There is no abdominal tenderness.  Musculoskeletal:     Right lower leg: No edema.     Left lower  leg: No edema.     Comments: Is able to move all extremities  Is status post bilateral fracture of superior and inferior pubic ramus   Lymphadenopathy:     Cervical: No cervical adenopathy.  Skin:    General: Skin is warm and dry.  Neurological:     Mental Status: She is alert and oriented to person, place, and time.  Psychiatric:        Mood and Affect: Mood normal.      ASSESSMENT/ PLAN:  TODAY ;  1. Essential hypertension: is stable b/p 122/58 will continue asa 81 mg daily metformin 500 mg twice daily   2. CKD stage 2 due to type 2 diabetes mellitus: is stable bun 35 creat 1.17  3. Type 2 diabetes mellitus with stage 3 chronic kidney disease without long term current use of insulin: is stable hgb a1c 6.2 will continue asa 81 mg daily is on statin  4. Dyslipidemia associated with type 2 diabetes mellitus: is stable will continue pravachol 80 mg daily   5. Multiple closed fractures of pelvis with stable disruption of pelvic circle sequela: is stable will continue therapy as directed; will follow up with orthopedics; will continue percocet 5/325 mg every 4 hours as needed  6. Chronic non-seasonal allergic rhinitis: is stable will continue zyrtec 10 mg daily   7. Chronic depression: is stable will continue prozac 40 mg daily          MD is aware of resident's narcotic use and is in agreement with current plan of care. We will attempt to wean resident as apropriate   Ok Edwards NP [facility]  Contact [phone] Monday through Friday 8am- 5pm  After hours call [phone]

## 2018-09-05 ENCOUNTER — Non-Acute Institutional Stay (SKILLED_NURSING_FACILITY): Payer: Medicare Other | Admitting: Adult Health

## 2018-09-05 ENCOUNTER — Other Ambulatory Visit (HOSPITAL_COMMUNITY)
Admission: AD | Admit: 2018-09-05 | Discharge: 2018-09-05 | Disposition: A | Discharge status: Home / Self Care | Payer: Medicare Other | Source: Skilled Nursing Facility | Attending: Internal Medicine | Admitting: Internal Medicine

## 2018-09-05 ENCOUNTER — Encounter: Payer: Self-pay | Admitting: Adult Health

## 2018-09-05 DIAGNOSIS — N183 Chronic kidney disease, stage 3 unspecified: Secondary | ICD-10-CM

## 2018-09-05 DIAGNOSIS — E1122 Type 2 diabetes mellitus with diabetic chronic kidney disease: Secondary | ICD-10-CM

## 2018-09-05 DIAGNOSIS — S32810S Multiple fractures of pelvis with stable disruption of pelvic ring, sequela: Secondary | ICD-10-CM | POA: Diagnosis not present

## 2018-09-05 LAB — CBC WITH DIFFERENTIAL/PLATELET
Abs Immature Granulocytes: 0.04 10*3/uL (ref 0.00–0.07)
Basophils Absolute: 0 10*3/uL (ref 0.0–0.1)
Basophils Relative: 0 %
Eosinophils Absolute: 0 10*3/uL (ref 0.0–0.5)
Eosinophils Relative: 0 %
HCT: 30.8 % — ABNORMAL LOW (ref 36.0–46.0)
Hemoglobin: 10.1 g/dL — ABNORMAL LOW (ref 12.0–15.0)
Immature Granulocytes: 0 %
Lymphocytes Relative: 10 %
Lymphs Abs: 1.2 10*3/uL (ref 0.7–4.0)
MCH: 29.5 pg (ref 26.0–34.0)
MCHC: 32.8 g/dL (ref 30.0–36.0)
MCV: 90.1 fL (ref 80.0–100.0)
Monocytes Absolute: 1.3 10*3/uL — ABNORMAL HIGH (ref 0.1–1.0)
Monocytes Relative: 11 %
Neutro Abs: 9.4 10*3/uL — ABNORMAL HIGH (ref 1.7–7.7)
Neutrophils Relative %: 79 %
Platelets: 153 10*3/uL (ref 150–400)
RBC: 3.42 MIL/uL — ABNORMAL LOW (ref 3.87–5.11)
RDW: 12.4 % (ref 11.5–15.5)
WBC: 12 10*3/uL — ABNORMAL HIGH (ref 4.0–10.5)
nRBC: 0 % (ref 0.0–0.2)

## 2018-09-05 LAB — URINALYSIS, COMPLETE (UACMP) WITH MICROSCOPIC
Bacteria, UA: NONE SEEN
Bilirubin Urine: NEGATIVE
Glucose, UA: NEGATIVE mg/dL
Ketones, ur: NEGATIVE mg/dL
Leukocytes,Ua: NEGATIVE
Nitrite: NEGATIVE
Protein, ur: 30 mg/dL — AB
Specific Gravity, Urine: 1.016 (ref 1.005–1.030)
pH: 5 (ref 5.0–8.0)

## 2018-09-05 NOTE — Progress Notes (Signed)
Location:   [place] Room Number: 159 P Place of Service:  SNF (31)   CODE STATUS: Full Code  Allergies  Allergen Reactions  . Augmentin [Amoxicillin-Pot Clavulanate] Hives  . Codeine Palpitations    Chief Complaint  Patient presents with  . Acute Visit    72 Hour Care Pan Meeting    HPI:  We have come together for her 73 hour care plan meeting. Her goal is to return back home. She has a walker; bsc and has a ramp at home. She will need a glucometer upon discharge as she has not been checking her cbgs at home. . She does no have any uncontrolled pain but her pain her right hip is worse. She continues to participate with therapy. There are no reports of fevers present. She denies any insomnia.   Past Medical History:  Diagnosis Date  . Cancer ([place])    endometrial   . Diabetes mellitus without complication ([place])   . Hyperlipidemia   . Hypertension     Past Surgical History:  Procedure Laterality Date  . ABDOMINAL HYSTERECTOMY    . BREAST SURGERY      Social History   Socioeconomic History  . Marital status: Single    Spouse name: Not on file  . Number of children: Not on file  . Years of education: Not on file  . Highest education level: Not on file  Occupational History  . Not on file  Social Needs  . Financial resource strain: Not on file  . Food insecurity:    Worry: Not on file    Inability: Not on file  . Transportation needs:    Medical: Not on file    Non-medical: Not on file  Tobacco Use  . Smoking status: Never Smoker  . Smokeless tobacco: Never Used  Substance and Sexual Activity  . Alcohol use: Yes    Comment: occ  . Drug use: Never  . Sexual activity: Not on file  Lifestyle  . Physical activity:    Days per week: Not on file    Minutes per session: Not on file  . Stress: Not on file  Relationships  . Social connections:    Talks on phone: Not on file    Gets together: Not on file    Attends religious  service: Not on file    Active member of club or organization: Not on file    Attends meetings of clubs or organizations: Not on file    Relationship status: Not on file  . Intimate partner violence:    Fear of current or ex partner: Not on file    Emotionally abused: Not on file    Physically abused: Not on file    Forced sexual activity: Not on file  Other Topics Concern  . Not on file  Social History Narrative  . Not on file   History reviewed. No pertinent family history.    VITAL SIGNS BP (!) 148/78   Pulse 72   Ht 5\' 6"  (1.676 m)   Wt 219 lb 4.8 oz (99.5 kg)   BMI 35.40 kg/m   Outpatient Encounter Medications as of 09/05/2018  Medication Sig  . aspirin EC 81 MG tablet Take 81 mg by mouth every morning.  . cetirizine (ZYRTEC) 10 MG tablet Take 10 mg by mouth at bedtime.   Marland Kitchen FLUoxetine (PROZAC) 40 MG capsule Take 40 mg by mouth every morning.   . metFORMIN (GLUCOPHAGE) 500 MG  tablet Take 500 mg by mouth 2 (two) times daily.  . NON FORMULARY Diet Type:  NAS, No concentrated Sweet  . oxyCODONE-acetaminophen (PERCOCET/ROXICET) 5-325 MG tablet Take 1 tablet by mouth every 4 (four) hours as needed for up to 3 days for moderate pain.  . polyethylene glycol (MIRALAX / GLYCOLAX) 17 g packet Take 17 g by mouth daily.  . pravastatin (PRAVACHOL) 80 MG tablet Take 80 mg by mouth every evening.    No facility-administered encounter medications on file as of 09/05/2018.      SIGNIFICANT DIAGNOSTIC EXAMS  PREVIOUS:   09-01-18: ct of head and cervical spine:  1. No acute intracranial findings and no acute cervical spine findings. 2. Atherosclerosis. 3. Mild chronic right frontal sinusitis. 4. Cervical spondylosis and degenerative disc disease with suspected impingement at C4-5, C5-6, and C6-7 due to spurring.  09-01-18: pelvic x-ray: Bilateral minimally displaced fractures of the superior and inferior pubic ramus.  09-01-18: lumbar spine x-ray: Degenerative change without acute  abnormality. Cholelithiasis in the upper quadrant.  09-01-18: chest x-ray: No active cardiopulmonary disease.  09-01-18: ct of pelvis:Superior and inferior pubic rami fractures bilaterally as well as mildly displaced right sacral fracture.   09-02-18: renal ultrasound:1. No hydronephrosis. 2. Nonobstructing RIGHT renal calculi. 3. Distended bladder.   NO NEW EXAMS.   LABS REVIEWED PREVIOUS:   09-01-18: wbc 15.1; hgb 12.3; hct 38.6; mcv 91.9; plt 212 glucose 159; bun 33; creat 1.66; k+ 4.0; na++ 139; ca 9.3 liver normal albumin 3.8  09-02-18: wbc 8.1; hgb 10.9; hct 34.1; mcv 92.2; plt 174; glucose 113; bun 42; creat 2.09; k+ 4.1; na++ 139; ca 8.4 hgb a1c 6.2  09-03-18: glucose 122; bun 35; creat 1.17; k+ 4.4; na++ 135; ca 8.4   NO NEW LABS.    Review of Systems  Constitutional: Negative for malaise/fatigue.  Respiratory: Negative for cough and shortness of breath.   Cardiovascular: Negative for chest pain, palpitations and leg swelling.  Gastrointestinal: Negative for abdominal pain, constipation and heartburn.  Musculoskeletal: Positive for joint pain. Negative for back pain and myalgias.       Right hip pain is managed   Skin: Negative.   Neurological: Negative for dizziness.  Psychiatric/Behavioral: The patient is not nervous/anxious.     Physical Exam Constitutional:      General: She is not in acute distress.    Appearance: She is obese. She is not diaphoretic.  Neck:     Musculoskeletal: Neck supple.     Thyroid: No thyromegaly.  Cardiovascular:     Rate and Rhythm: Normal rate and regular rhythm.     Pulses: Normal pulses.     Heart sounds: Normal heart sounds.  Pulmonary:     Effort: Pulmonary effort is normal. No respiratory distress.     Breath sounds: Normal breath sounds.  Abdominal:     General: Bowel sounds are normal. There is no distension.     Palpations: Abdomen is soft.     Tenderness: There is no abdominal tenderness.  Musculoskeletal:     Right lower  leg: No edema.     Left lower leg: No edema.     Comments: Is able to move all extremities  Is status post bilateral fracture of superior and inferior pubic ramus    Lymphadenopathy:     Cervical: No cervical adenopathy.  Skin:    General: Skin is warm and dry.  Neurological:     Mental Status: She is alert and oriented to person, place, and  time.  Psychiatric:        Mood and Affect: Mood normal.      ASSESSMENT/ PLAN:  TODAY ;  1. Multiple closed fractures of pelvis with stable disruption of pelvic circle sequela; 2. Type 2 diabetes mellitus with stage 3 chronic kidney disease without long term current use of insulin  Will continue therapy as directed Will continue current medications Her goal is return home She will need a glucometer upon discharge    MD is aware of resident's narcotic use and is in agreement with current plan of care. We will attempt to wean resident as apropriate   Ok Edwards NP [facility] Adult Medicine  Contact [phone] Monday through Friday 8am- 5pm  After hours call [phone]

## 2018-09-06 ENCOUNTER — Encounter: Payer: Self-pay | Admitting: Adult Health

## 2018-09-06 ENCOUNTER — Other Ambulatory Visit (HOSPITAL_COMMUNITY)
Admission: RE | Admit: 2018-09-06 | Discharge: 2018-09-06 | Disposition: A | Discharge status: Home / Self Care | Payer: Medicare Other | Source: Skilled Nursing Facility | Attending: Internal Medicine | Admitting: Internal Medicine

## 2018-09-06 ENCOUNTER — Non-Acute Institutional Stay (SKILLED_NURSING_FACILITY): Payer: Medicare Other | Admitting: Adult Health

## 2018-09-06 ENCOUNTER — Ambulatory Visit (HOSPITAL_COMMUNITY)
Admission: RE | Admit: 2018-09-06 | Discharge: 2018-09-06 | Disposition: A | Discharge status: Home / Self Care | Payer: Medicare Other | Source: Ambulatory Visit | Attending: Internal Medicine | Admitting: Internal Medicine

## 2018-09-06 DIAGNOSIS — R509 Fever, unspecified: Secondary | ICD-10-CM | POA: Insufficient documentation

## 2018-09-06 DIAGNOSIS — J189 Pneumonia, unspecified organism: Secondary | ICD-10-CM | POA: Insufficient documentation

## 2018-09-06 DIAGNOSIS — Z20828 Contact with and (suspected) exposure to other viral communicable diseases: Secondary | ICD-10-CM | POA: Insufficient documentation

## 2018-09-06 DIAGNOSIS — J181 Lobar pneumonia, unspecified organism: Secondary | ICD-10-CM

## 2018-09-06 LAB — SARS CORONAVIRUS 2 BY RT PCR (HOSPITAL ORDER, PERFORMED IN ~~LOC~~ HOSPITAL LAB): SARS Coronavirus 2: NEGATIVE

## 2018-09-06 NOTE — Progress Notes (Signed)
Location:   [place] Room Number: 159 P Place of Service:  SNF (31)   CODE STATUS: Full Code  Allergies  Allergen Reactions  . Augmentin [Amoxicillin-Pot Clavulanate] Hives  . Codeine Palpitations    Chief Complaint  Patient presents with  . Acute Visit    Fever    HPI:  She spiked a temp yesterday evening of 102.8. she denies any cough; shortness of breath; no dysuria; no abdominal pain; nausea or vomiting. She does have right hip pain which is being managed. She had a cbc u/a done last night with wbc 12.0. her UA was negative for infection. We did get a chest x-ray which does demonstrate right lower lobe infiltrate. Blood cultures were drawn and her covid test is negative.    Past Medical History:  Diagnosis Date  . Cancer ([place])    endometrial   . Diabetes mellitus without complication ([place])   . Hyperlipidemia   . Hypertension     Past Surgical History:  Procedure Laterality Date  . ABDOMINAL HYSTERECTOMY    . BREAST SURGERY      Social History   Socioeconomic History  . Marital status: Single    Spouse name: Not on file  . Number of children: Not on file  . Years of education: Not on file  . Highest education level: Not on file  Occupational History  . Not on file  Social Needs  . Financial resource strain: Not on file  . Food insecurity:    Worry: Not on file    Inability: Not on file  . Transportation needs:    Medical: Not on file    Non-medical: Not on file  Tobacco Use  . Smoking status: Never Smoker  . Smokeless tobacco: Never Used  Substance and Sexual Activity  . Alcohol use: Yes    Comment: occ  . Drug use: Never  . Sexual activity: Not on file  Lifestyle  . Physical activity:    Days per week: Not on file    Minutes per session: Not on file  . Stress: Not on file  Relationships  . Social connections:    Talks on phone: Not on file    Gets together: Not on file    Attends religious service: Not on  file    Active member of club or organization: Not on file    Attends meetings of clubs or organizations: Not on file    Relationship status: Not on file  . Intimate partner violence:    Fear of current or ex partner: Not on file    Emotionally abused: Not on file    Physically abused: Not on file    Forced sexual activity: Not on file  Other Topics Concern  . Not on file  Social History Narrative  . Not on file   History reviewed. No pertinent family history.    VITAL SIGNS BP (!) 133/49   Pulse 82   Temp 99.3 F (37.4 C)   Resp 17   Ht 5\' 6"  (1.676 m)   Wt 217 lb 14.4 oz (98.8 kg)   SpO2 99%   BMI 35.17 kg/m   Outpatient Encounter Medications as of 09/06/2018  Medication Sig  . aspirin EC 81 MG tablet Take 81 mg by mouth every morning.  . cetirizine (ZYRTEC) 10 MG tablet Take 10 mg by mouth at bedtime.   Marland Kitchen FLUoxetine (PROZAC) 40 MG capsule Take 40 mg by mouth every morning.   Marland Kitchen  metFORMIN (GLUCOPHAGE) 500 MG tablet Take 500 mg by mouth 2 (two) times daily.  . NON FORMULARY Diet Type:  NAS, No concentrated Sweet  . oxyCODONE-acetaminophen (PERCOCET/ROXICET) 5-325 MG tablet Take 1 tablet by mouth every 4 (four) hours as needed for up to 3 days for moderate pain.  . polyethylene glycol (MIRALAX / GLYCOLAX) 17 g packet Take 17 g by mouth daily.  . pravastatin (PRAVACHOL) 80 MG tablet Take 80 mg by mouth every evening.    No facility-administered encounter medications on file as of 09/06/2018.      SIGNIFICANT DIAGNOSTIC EXAMS  PREVIOUS:   09-01-18: ct of head and cervical spine:  1. No acute intracranial findings and no acute cervical spine findings. 2. Atherosclerosis. 3. Mild chronic right frontal sinusitis. 4. Cervical spondylosis and degenerative disc disease with suspected impingement at C4-5, C5-6, and C6-7 due to spurring.  09-01-18: pelvic x-ray: Bilateral minimally displaced fractures of the superior and inferior pubic ramus.  09-01-18: lumbar spine x-ray:  Degenerative change without acute abnormality. Cholelithiasis in the upper quadrant.  09-01-18: chest x-ray: No active cardiopulmonary disease.  09-01-18: ct of pelvis:Superior and inferior pubic rami fractures bilaterally as well as mildly displaced right sacral fracture.   09-02-18: renal ultrasound:1. No hydronephrosis. 2. Nonobstructing RIGHT renal calculi. 3. Distended bladder.   TODAY:   09-06-18: chest x-ray: right lower lobe infiltrate   LABS REVIEWED PREVIOUS:   09-01-18: wbc 15.1; hgb 12.3; hct 38.6; mcv 91.9; plt 212 glucose 159; bun 33; creat 1.66; k+ 4.0; na++ 139; ca 9.3 liver normal albumin 3.8  09-02-18: wbc 8.1; hgb 10.9; hct 34.1; mcv 92.2; plt 174; glucose 113; bun 42; creat 2.09; k+ 4.1; na++ 139; ca 8.4 hgb a1c 6.2  09-03-18: glucose 122; bun 35; creat 1.17; k+ 4.4; na++ 135; ca 8.4   TODAY:    09-05-18: wbc 12.0; hgb 10.1; hct 30.8; mcv 90.1 plt 153 UA: neg   Review of Systems  Constitutional: Negative for malaise/fatigue.  Respiratory: Negative for cough and shortness of breath.   Cardiovascular: Negative for chest pain, palpitations and leg swelling.  Gastrointestinal: Negative for abdominal pain, constipation and heartburn.  Musculoskeletal: Positive for joint pain. Negative for back pain and myalgias.       Right hip pain is managed   Skin: Negative.   Neurological: Negative for dizziness.  Psychiatric/Behavioral: The patient is not nervous/anxious.    Physical Exam Constitutional:      General: She is not in acute distress.    Appearance: She is obese. She is not diaphoretic.  Neck:     Musculoskeletal: Neck supple.     Thyroid: No thyromegaly.  Cardiovascular:     Rate and Rhythm: Normal rate and regular rhythm.     Pulses: Normal pulses.     Heart sounds: Normal heart sounds.  Pulmonary:     Effort: Pulmonary effort is normal. No respiratory distress.     Breath sounds: Rhonchi and rales present.  Abdominal:     General: Bowel sounds are normal.  There is no distension.     Palpations: Abdomen is soft.     Tenderness: There is no abdominal tenderness.  Musculoskeletal:     Right lower leg: No edema.     Left lower leg: No edema.     Comments:  Is able to move all extremities  Is status post bilateral fracture of superior and inferior pubic ramus     Lymphadenopathy:     Cervical: No cervical adenopathy.  Skin:  General: Skin is warm and dry.  Neurological:     Mental Status: She is alert and oriented to person, place, and time.  Psychiatric:        Mood and Affect: Mood normal.        ASSESSMENT/ PLAN:  TODAY ;  1.  Pneumonia of right lower lobe due to infectious organism: is worse: will begin levaquin 750 mg every other day through 09-16-18 with probiotic twice daily will monitor her status.      MD is aware of resident's narcotic use and is in agreement with current plan of care. We will attempt to wean resident as apropriate   Ok Edwards NP [facility] Adult Medicine  Contact [phone] Monday through Friday 8am- 5pm  After hours call [phone]

## 2018-09-07 ENCOUNTER — Other Ambulatory Visit: Payer: Self-pay | Admitting: Adult Health

## 2018-09-07 ENCOUNTER — Encounter: Payer: Self-pay | Admitting: Adult Health

## 2018-09-07 ENCOUNTER — Non-Acute Institutional Stay (SKILLED_NURSING_FACILITY): Payer: Medicare Other | Admitting: Adult Health

## 2018-09-07 DIAGNOSIS — S3210XS Unspecified fracture of sacrum, sequela: Secondary | ICD-10-CM

## 2018-09-07 DIAGNOSIS — S32810S Multiple fractures of pelvis with stable disruption of pelvic ring, sequela: Secondary | ICD-10-CM | POA: Diagnosis not present

## 2018-09-07 MED ORDER — OXYCODONE-ACETAMINOPHEN 5-325 MG PO TABS: 1.0000 | ORAL_TABLET | Freq: Three times a day (TID) | ORAL | 0 refills | Status: AC | PRN

## 2018-09-07 NOTE — Progress Notes (Signed)
Location:    [place] Room Number: 159/P Place of Service:  SNF (31)   CODE STATUS: Full Code  Allergies  Allergen Reactions  . Augmentin [Amoxicillin-Pot Clavulanate] Hives  . Codeine Palpitations    Chief Complaint  Patient presents with  . Acute Visit    PRN Pain Management    HPI:  She has multiple pelvic fractures sustained from falling off a horse. She has percocet ordered for pain management. She tells me that she is not using this medication unless absolutely necessary. We have discussed her pain medication. She does not have any uncontrolled pain; her right hip pain is better with a pillow in her wheelchair. She denies any constipation or insomnia. There are no reports of fevers present. We will lower her pain medication frequency.   Past Medical History:  Diagnosis Date  . Cancer ([place])    endometrial   . Diabetes mellitus without complication ([place])   . Hyperlipidemia   . Hypertension     Past Surgical History:  Procedure Laterality Date  . ABDOMINAL HYSTERECTOMY    . BREAST SURGERY      Social History   Socioeconomic History  . Marital status: Single    Spouse name: Not on file  . Number of children: Not on file  . Years of education: Not on file  . Highest education level: Not on file  Occupational History  . Not on file  Social Needs  . Financial resource strain: Not on file  . Food insecurity:    Worry: Not on file    Inability: Not on file  . Transportation needs:    Medical: Not on file    Non-medical: Not on file  Tobacco Use  . Smoking status: Never Smoker  . Smokeless tobacco: Never Used  Substance and Sexual Activity  . Alcohol use: Yes    Comment: occ  . Drug use: Never  . Sexual activity: Not on file  Lifestyle  . Physical activity:    Days per week: Not on file    Minutes per session: Not on file  . Stress: Not on file  Relationships  . Social connections:    Talks on phone: Not on file    Gets  together: Not on file    Attends religious service: Not on file    Active member of club or organization: Not on file    Attends meetings of clubs or organizations: Not on file    Relationship status: Not on file  . Intimate partner violence:    Fear of current or ex partner: Not on file    Emotionally abused: Not on file    Physically abused: Not on file    Forced sexual activity: Not on file  Other Topics Concern  . Not on file  Social History Narrative  . Not on file   History reviewed. No pertinent family history.    VITAL SIGNS BP 105/67   Pulse 75   Temp (!) 97.3 F (36.3 C) (Oral)   Resp 20   SpO2 99%   Outpatient Encounter Medications as of 09/07/2018  Medication Sig  . aspirin EC 81 MG tablet Take 81 mg by mouth every morning.  . cetirizine (ZYRTEC) 10 MG tablet Take 10 mg by mouth at bedtime.   Marland Kitchen FLUoxetine (PROZAC) 40 MG capsule Take 40 mg by mouth every morning.   Marland Kitchen levofloxacin (LEVAQUIN) 750 MG tablet Take 750 mg by mouth every other day.  Marland Kitchen  metFORMIN (GLUCOPHAGE) 500 MG tablet Take 500 mg by mouth 2 (two) times daily.  . NON FORMULARY Diet Type:  NAS, No concentrated Sweet  . oxyCODONE-acetaminophen (PERCOCET/ROXICET) 5-325 MG tablet Take 1 tablet by mouth every 4 (four) hours as needed for up to 3 days for moderate pain.  . polyethylene glycol (MIRALAX / GLYCOLAX) 17 g packet Take 17 g by mouth daily.  . pravastatin (PRAVACHOL) 80 MG tablet Take 80 mg by mouth every evening.    No facility-administered encounter medications on file as of 09/07/2018.      SIGNIFICANT DIAGNOSTIC EXAMS   PREVIOUS:   09-01-18: ct of head and cervical spine:  1. No acute intracranial findings and no acute cervical spine findings. 2. Atherosclerosis. 3. Mild chronic right frontal sinusitis. 4. Cervical spondylosis and degenerative disc disease with suspected impingement at C4-5, C5-6, and C6-7 due to spurring.  09-01-18: pelvic x-ray: Bilateral minimally displaced fractures  of the superior and inferior pubic ramus.  09-01-18: lumbar spine x-ray: Degenerative change without acute abnormality. Cholelithiasis in the upper quadrant.  09-01-18: chest x-ray: No active cardiopulmonary disease.  09-01-18: ct of pelvis:Superior and inferior pubic rami fractures bilaterally as well as mildly displaced right sacral fracture.   09-02-18: renal ultrasound:1. No hydronephrosis. 2. Nonobstructing RIGHT renal calculi. 3. Distended bladder.   09-06-18: chest x-ray: right lower lobe infiltrate   NO NEW EXAMS.   LABS REVIEWED PREVIOUS:   09-01-18: wbc 15.1; hgb 12.3; hct 38.6; mcv 91.9; plt 212 glucose 159; bun 33; creat 1.66; k+ 4.0; na++ 139; ca 9.3 liver normal albumin 3.8  09-02-18: wbc 8.1; hgb 10.9; hct 34.1; mcv 92.2; plt 174; glucose 113; bun 42; creat 2.09; k+ 4.1; na++ 139; ca 8.4 hgb a1c 6.2  09-03-18: glucose 122; bun 35; creat 1.17; k+ 4.4; na++ 135; ca 8.4  09-05-18: wbc 12.0; hgb 10.1; hct 30.8; mcv 90.1 plt 153 UA: neg  TODAY;   09-06-18: blood cultures: no growth     Review of Systems  Constitutional: Negative for malaise/fatigue.  Respiratory: Negative for cough and shortness of breath.   Cardiovascular: Negative for chest pain, palpitations and leg swelling.  Gastrointestinal: Negative for abdominal pain, constipation and heartburn.  Musculoskeletal: Positive for joint pain. Negative for back pain and myalgias.       Right hip pain is managed   Skin: Negative.   Neurological: Negative for dizziness.  Psychiatric/Behavioral: The patient is not nervous/anxious.      Physical Exam Constitutional:      General: She is not in acute distress.    Appearance: She is obese. She is not diaphoretic.  Neck:     Musculoskeletal: Neck supple.     Thyroid: No thyromegaly.  Cardiovascular:     Rate and Rhythm: Normal rate and regular rhythm.     Pulses: Normal pulses.     Heart sounds: Normal heart sounds.  Pulmonary:     Effort: Pulmonary effort is normal. No  respiratory distress.     Breath sounds: Normal breath sounds.  Abdominal:     General: Bowel sounds are normal. There is no distension.     Palpations: Abdomen is soft.     Tenderness: There is no abdominal tenderness.  Musculoskeletal:     Right lower leg: No edema.     Left lower leg: No edema.     Comments:  Is able to move all extremities  Is status post bilateral fracture of superior and inferior pubic ramus  Lymphadenopathy:     Cervical: No cervical adenopathy.  Skin:    General: Skin is warm and dry.  Neurological:     Mental Status: She is alert and oriented to person, place, and time.  Psychiatric:        Mood and Affect: Mood normal.      ASSESSMENT/ PLAN:  TODAY:   1. Multiple closed fractures of pelvis with stable disruption of pelvic ring, sequela 2. Closed fracture of sacrum, unspecified portion of sacrum sequela  Will continue therapy as directed Will change her percocet to 5/325 mg every 8 hours as needed and will review on 09-12-18.     MD is aware of resident's narcotic use and is in agreement with current plan of care. We will attempt to wean resident as apropriate   Ok Edwards NP [facility] Adult Medicine  Contact [phone] Monday through Friday 8am- 5pm  After hours call [phone]

## 2018-09-10 ENCOUNTER — Encounter: Payer: Self-pay | Admitting: Internal Medicine

## 2018-09-10 ENCOUNTER — Non-Acute Institutional Stay (SKILLED_NURSING_FACILITY): Payer: Medicare Other | Admitting: Internal Medicine

## 2018-09-10 ENCOUNTER — Other Ambulatory Visit: Payer: Self-pay | Admitting: Adult Health

## 2018-09-10 DIAGNOSIS — N183 Chronic kidney disease, stage 3 (moderate): Secondary | ICD-10-CM

## 2018-09-10 DIAGNOSIS — E119 Type 2 diabetes mellitus without complications: Secondary | ICD-10-CM

## 2018-09-10 DIAGNOSIS — E1169 Type 2 diabetes mellitus with other specified complication: Secondary | ICD-10-CM

## 2018-09-10 DIAGNOSIS — F32A Depression, unspecified: Secondary | ICD-10-CM

## 2018-09-10 DIAGNOSIS — S32592D Other specified fracture of left pubis, subsequent encounter for fracture with routine healing: Secondary | ICD-10-CM

## 2018-09-10 DIAGNOSIS — I10 Essential (primary) hypertension: Secondary | ICD-10-CM | POA: Diagnosis not present

## 2018-09-10 DIAGNOSIS — E785 Hyperlipidemia, unspecified: Secondary | ICD-10-CM

## 2018-09-10 DIAGNOSIS — N179 Acute kidney failure, unspecified: Secondary | ICD-10-CM

## 2018-09-10 DIAGNOSIS — J181 Lobar pneumonia, unspecified organism: Secondary | ICD-10-CM

## 2018-09-10 DIAGNOSIS — F329 Major depressive disorder, single episode, unspecified: Secondary | ICD-10-CM

## 2018-09-10 DIAGNOSIS — S32591D Other specified fracture of right pubis, subsequent encounter for fracture with routine healing: Secondary | ICD-10-CM

## 2018-09-10 DIAGNOSIS — J189 Pneumonia, unspecified organism: Secondary | ICD-10-CM

## 2018-09-10 NOTE — Progress Notes (Signed)
Provider:  Veleta Miners, MD Location:  [facility] Room Number: 159 P Place of Service:  SNF (31)  PCP: Virgie Dad, MD Patient Care Team: Virgie Dad, MD as PCP - General (Internal Medicine)  Extended Emergency Contact Information Primary Emergency Contact: johnson,Karen Mobile Phone: [phone] Relation: Sister Interpreter needed? No  Code Status: Full Code Goals of Care: Advanced Directive information Advanced Directives 09/10/2018  Does Patient Have a Medical Advance Directive? Yes  Type of Advance Directive -  Does patient want to make changes to medical advance directive? No - Patient declined  Would patient like information on creating a medical advance directive? No - Patient declined      Chief Complaint  Patient presents with  . New Admit To SNF    Admission    HPI: Patient is a 73 y.o. female seen today for admission to SNF for therapy He was admitted in the hospital from 05/09-05/11 bilateral superior and inferior pubic rami fracture sustained after a fall  Patient has a history of diabetes mellitus, hyperlipidemia, hypertension, endometrial carcinoma, right-sided breast cancer.  Patient says that she was riding her horse for training for upcoming show.  When she lost balance and fell.  She in the hospital she was found to have a bilateral superior inferior pubic rami fracture.  Her CT of the cervical spine showed degenerative disc disease.  Her other imaging were negative She also had a renal ultrasound done for creatinine which peaked at 2.2.09. Patient was managed conservatively and is now in SNF for therapy and pain control. Over the weekend patient spiked a temp of 102.  She had x-ray done which showed right lower lobe pneumonia.  She is on Levaquin and is doing well She denies any cough chest pain shortness of breath or fever. Patient only complaint was pain specially on her right pelvis area.  She said she was able to walk  with therapy today Patient's niece lives with her but patient is very independent.   Past Medical History:  Diagnosis Date  . Cancer ([place])    endometrial   . Diabetes mellitus without complication ([place])   . Hyperlipidemia   . Hypertension    Past Surgical History:  Procedure Laterality Date  . ABDOMINAL HYSTERECTOMY    . BREAST SURGERY      reports that she has never smoked. She has never used smokeless tobacco. She reports current alcohol use. She reports that she does not use drugs. Social History   Socioeconomic History  . Marital status: Single    Spouse name: Not on file  . Number of children: Not on file  . Years of education: Not on file  . Highest education level: Not on file  Occupational History  . Not on file  Social Needs  . Financial resource strain: Not on file  . Food insecurity:    Worry: Not on file    Inability: Not on file  . Transportation needs:    Medical: Not on file    Non-medical: Not on file  Tobacco Use  . Smoking status: Never Smoker  . Smokeless tobacco: Never Used  Substance and Sexual Activity  . Alcohol use: Yes    Comment: occ  . Drug use: Never  . Sexual activity: Not on file  Lifestyle  . Physical activity:    Days per week: Not on file    Minutes per session: Not on file  . Stress: Not on file  Relationships  .  Social connections:    Talks on phone: Not on file    Gets together: Not on file    Attends religious service: Not on file    Active member of club or organization: Not on file    Attends meetings of clubs or organizations: Not on file    Relationship status: Not on file  . Intimate partner violence:    Fear of current or ex partner: Not on file    Emotionally abused: Not on file    Physically abused: Not on file    Forced sexual activity: Not on file  Other Topics Concern  . Not on file  Social History Narrative  . Not on file    Functional Status Survey:    History reviewed. No pertinent family  history.  Health Maintenance  Topic Date Due  . OPHTHALMOLOGY EXAM  10/06/2018 (Originally 07/29/1955)  . URINE MICROALBUMIN  10/06/2018 (Originally 07/29/1955)  . DEXA SCAN  10/06/2018 (Originally 07/29/2012)  . COLONOSCOPY  10/06/2018 (Originally 07/29/1997)  . FOOT EXAM  10/08/2018 (Originally 07/29/1955)  . MAMMOGRAM  10/08/2018 (Originally 07/29/1997)  . Hepatitis C Screening  10/08/2018 (Originally 04-15-1946)  . INFLUENZA VACCINE  11/24/2018  . HEMOGLOBIN A1C  03/05/2019  . TETANUS/TDAP  10/11/2021  . PNA vac Low Risk Adult  Completed    Allergies  Allergen Reactions  . Augmentin [Amoxicillin-Pot Clavulanate] Hives  . Codeine Palpitations    Outpatient Encounter Medications as of 09/10/2018  Medication Sig  . aspirin EC 81 MG tablet Take 81 mg by mouth every morning.  . cetirizine (ZYRTEC) 10 MG tablet Take 10 mg by mouth at bedtime.   Marland Kitchen FLUoxetine (PROZAC) 40 MG capsule Take 40 mg by mouth every morning.   Marland Kitchen levofloxacin (LEVAQUIN) 750 MG tablet Take 750 mg by mouth every other day.  . metFORMIN (GLUCOPHAGE) 500 MG tablet Take 500 mg by mouth 2 (two) times daily.  . NON FORMULARY Diet Type: Con Carb  . oxyCODONE-acetaminophen (PERCOCET/ROXICET) 5-325 MG tablet Take 1 tablet by mouth every 8 (eight) hours as needed for up to 5 days for moderate pain.  . polyethylene glycol (MIRALAX / GLYCOLAX) 17 g packet Take 17 g by mouth daily.  . pravastatin (PRAVACHOL) 80 MG tablet Take 80 mg by mouth every evening.   . Probiotic Product (RISA-BID PROBIOTIC) TABS Take 1 tablet by mouth 2 (two) times a day.   No facility-administered encounter medications on file as of 09/10/2018.     Review of Systems  Constitutional: Positive for activity change.  HENT: Negative.   Respiratory: Negative.   Cardiovascular: Negative.   Gastrointestinal: Negative.   Genitourinary: Positive for pelvic pain.  Musculoskeletal: Positive for back pain and myalgias.  Skin: Negative.   Neurological: Negative.    Psychiatric/Behavioral: Negative.     Vitals:   09/10/18 1009  BP: (!) 124/57  Pulse: 64  Resp: 18  Temp: 98 F (36.7 C)  SpO2: 99%  Weight: 217 lb 14.4 oz (98.8 kg)  Height: 5\' 6"  (1.676 m)   Body mass index is 35.17 kg/m. Physical Exam Vitals signs reviewed.  Constitutional:      Appearance: Normal appearance.  HENT:     Head: Normocephalic.     Nose: Nose normal.     Mouth/Throat:     Mouth: Mucous membranes are moist.     Pharynx: Oropharynx is clear.  Eyes:     Pupils: Pupils are equal, round, and reactive to light.  Neck:     Musculoskeletal:  Neck supple.  Cardiovascular:     Rate and Rhythm: Normal rate and regular rhythm.     Pulses: Normal pulses.     Heart sounds: Normal heart sounds.  Pulmonary:     Effort: Pulmonary effort is normal. No respiratory distress.     Breath sounds: Normal breath sounds. No wheezing.  Abdominal:     General: Abdomen is flat. Bowel sounds are normal. There is no distension.     Palpations: Abdomen is soft.     Tenderness: There is no abdominal tenderness. There is no guarding.  Musculoskeletal:        General: No swelling.  Skin:    General: Skin is warm and dry.  Neurological:     Mental Status: She is alert and oriented to person, place, and time.     Comments: Has some weakness in LE Bilateral   Psychiatric:        Mood and Affect: Mood normal.        Thought Content: Thought content normal.     Labs reviewed: Basic Metabolic Panel: Recent Labs    09/01/18 1647 09/02/18 0600 09/03/18 0633  NA 139 139 135  K 4.0 4.1 4.4  CL 101 104 104  CO2 25 24 23   GLUCOSE 159* 113* 122*  BUN 33* 42* 35*  CREATININE 1.66* 2.09* 1.17*  CALCIUM 9.3 8.4* 8.4*   Liver Function Tests: Recent Labs    09/01/18 1647  AST 30  ALT 22  ALKPHOS 41  BILITOT 0.7  PROT 6.8  ALBUMIN 3.8   No results for input(s): LIPASE, AMYLASE in the last 8760 hours. No results for input(s): AMMONIA in the last 8760 hours. CBC: Recent  Labs    09/01/18 1647 09/02/18 0600 09/05/18 1800  WBC 15.1* 8.1 12.0*  NEUTROABS 12.4*  --  9.4*  HGB 12.3 10.9* 10.1*  HCT 38.6 34.1* 30.8*  MCV 91.9 92.2 90.1  PLT 212 174 153   Cardiac Enzymes: No results for input(s): CKTOTAL, CKMB, CKMBINDEX, TROPONINI in the last 8760 hours. BNP: Invalid input(s): POCBNP Lab Results  Component Value Date   HGBA1C 6.2 (H) 09/02/2018   No results found for: TSH No results found for: VITAMINB12 No results found for: FOLATE No results found for: IRON, TIBC, FERRITIN  Imaging and Procedures obtained prior to SNF admission: Dg Chest 2 View  Result Date: 09/06/2018 CLINICAL DATA:  Fevers EXAM: CHEST - 2 VIEW COMPARISON:  09/01/2018 FINDINGS: Cardiac shadows within normal limits. Mild increased right lower lobe infiltrate is noted. No bony abnormality is seen. No sizable effusion is noted. IMPRESSION: Right lower lobe infiltrate. Electronically Signed   By: Inez Catalina M.D.   On: 09/06/2018 10:31    Assessment/Plan Pneumonia of right lower lobe  On Levaquin Afebrile Doing well  Diabetes mellitus  On metformin A1C was 6.2 in hospital  Closed bilateral fracture of pubic rami  Continue percocet for pain WBAT Conservative Management Doing well with therapy Acute renal failure superimposed on stage 3 chronic kidney disease, [place] was normal in hospital Creat close to Baseline  Chronic depression Continue Prozac Hyperlipidemia On statin    Family/ staff Communication:   Labs/tests ordered:BMP and CBC in 1 week Total time spent in this patient care encounter was  45_  minutes; greater than 50% of the visit spent counseling patient and staff, reviewing records , Labs and coordinating care for problems addressed at this encounter.

## 2018-09-11 ENCOUNTER — Encounter: Payer: Self-pay | Admitting: Adult Health

## 2018-09-11 ENCOUNTER — Non-Acute Institutional Stay (SKILLED_NURSING_FACILITY): Payer: Medicare Other | Admitting: Adult Health

## 2018-09-11 DIAGNOSIS — I10 Essential (primary) hypertension: Secondary | ICD-10-CM

## 2018-09-11 DIAGNOSIS — J181 Lobar pneumonia, unspecified organism: Secondary | ICD-10-CM

## 2018-09-11 DIAGNOSIS — J189 Pneumonia, unspecified organism: Secondary | ICD-10-CM

## 2018-09-11 DIAGNOSIS — S32810S Multiple fractures of pelvis with stable disruption of pelvic ring, sequela: Secondary | ICD-10-CM | POA: Diagnosis not present

## 2018-09-11 DIAGNOSIS — N183 Chronic kidney disease, stage 3 unspecified: Secondary | ICD-10-CM

## 2018-09-11 DIAGNOSIS — E1122 Type 2 diabetes mellitus with diabetic chronic kidney disease: Secondary | ICD-10-CM | POA: Diagnosis not present

## 2018-09-11 LAB — CULTURE, BLOOD (ROUTINE X 2)
Culture: NO GROWTH
Culture: NO GROWTH
Special Requests: ADEQUATE
Special Requests: ADEQUATE

## 2018-09-11 NOTE — Progress Notes (Signed)
Location:   [facility] Room Number: 159 P Place of Service:  SNF (31)   CODE STATUS: Full Code  Allergies  Allergen Reactions  . Augmentin [Amoxicillin-Pot Clavulanate] Hives  . Codeine Palpitations    Chief Complaint  Patient presents with  . Medical Management of Chronic Issues    ckd stage 3 due to type 2 diabetes mellitus; essential hypertension; multiple closed fractures of pelvis with stable disruption of pelvic ring sequela.   Weekly follow up for the first 30 days post hospitalization     HPI:  She is a 73 year old short term rehab patient being seen for the management of her chronic illnesses: ckd stage 3; hypertension; pelvic fractures. She is presently being treated for pneumonia. She denies any cough or shortness of breath; no further reports of fevers. No uncontrolled pain. No changes in appetite.   Past Medical History:  Diagnosis Date  . Cancer ([place])    endometrial   . Diabetes mellitus without complication ([place])   . Hyperlipidemia   . Hypertension     Past Surgical History:  Procedure Laterality Date  . ABDOMINAL HYSTERECTOMY    . BREAST SURGERY      Social History   Socioeconomic History  . Marital status: Single    Spouse name: Not on file  . Number of children: Not on file  . Years of education: Not on file  . Highest education level: Not on file  Occupational History  . Not on file  Social Needs  . Financial resource strain: Not on file  . Food insecurity:    Worry: Not on file    Inability: Not on file  . Transportation needs:    Medical: Not on file    Non-medical: Not on file  Tobacco Use  . Smoking status: Never Smoker  . Smokeless tobacco: Never Used  Substance and Sexual Activity  . Alcohol use: Yes    Comment: occ  . Drug use: Never  . Sexual activity: Not on file  Lifestyle  . Physical activity:    Days per week: Not on file    Minutes per session: Not on file  . Stress: Not on file   Relationships  . Social connections:    Talks on phone: Not on file    Gets together: Not on file    Attends religious service: Not on file    Active member of club or organization: Not on file    Attends meetings of clubs or organizations: Not on file    Relationship status: Not on file  . Intimate partner violence:    Fear of current or ex partner: Not on file    Emotionally abused: Not on file    Physically abused: Not on file    Forced sexual activity: Not on file  Other Topics Concern  . Not on file  Social History Narrative  . Not on file   History reviewed. No pertinent family history.    VITAL SIGNS BP 122/77   Pulse 74   Temp 98 F (36.7 C)   Resp 20   Ht 5\' 6"  (1.676 m)   Wt 213 lb 12.8 oz (97 kg)   SpO2 99%   BMI 34.51 kg/m   Outpatient Encounter Medications as of 09/11/2018  Medication Sig  . aspirin EC 81 MG tablet Take 81 mg by mouth every morning.  . cetirizine (ZYRTEC) 10 MG tablet Take 10 mg by mouth at bedtime.   Marland Kitchen  FLUoxetine (PROZAC) 40 MG capsule Take 40 mg by mouth every morning.   . Glucerna (GLUCERNA) LIQD Take 237 mLs by mouth 2 (two) times daily between meals.  Marland Kitchen levofloxacin (LEVAQUIN) 750 MG tablet Take 750 mg by mouth every other day.  . metFORMIN (GLUCOPHAGE) 500 MG tablet Take 500 mg by mouth 2 (two) times daily.  . NON FORMULARY Diet Type: Con Carb  . oxyCODONE-acetaminophen (PERCOCET/ROXICET) 5-325 MG tablet Take 1 tablet by mouth every 8 (eight) hours as needed for up to 5 days for moderate pain.  . polyethylene glycol (MIRALAX / GLYCOLAX) 17 g packet Take 17 g by mouth daily.  . pravastatin (PRAVACHOL) 80 MG tablet Take 80 mg by mouth every evening.   . Probiotic Product (RISA-BID PROBIOTIC) TABS Take 1 tablet by mouth 2 (two) times a day.   No facility-administered encounter medications on file as of 09/11/2018.      SIGNIFICANT DIAGNOSTIC EXAMS  PREVIOUS:   09-01-18: ct of head and cervical spine:  1. No acute intracranial  findings and no acute cervical spine findings. 2. Atherosclerosis. 3. Mild chronic right frontal sinusitis. 4. Cervical spondylosis and degenerative disc disease with suspected impingement at C4-5, C5-6, and C6-7 due to spurring.  09-01-18: pelvic x-ray: Bilateral minimally displaced fractures of the superior and inferior pubic ramus.  09-01-18: lumbar spine x-ray: Degenerative change without acute abnormality. Cholelithiasis in the upper quadrant.  09-01-18: chest x-ray: No active cardiopulmonary disease.  09-01-18: ct of pelvis:Superior and inferior pubic rami fractures bilaterally as well as mildly displaced right sacral fracture.   09-02-18: renal ultrasound:1. No hydronephrosis. 2. Nonobstructing RIGHT renal calculi. 3. Distended bladder.   09-06-18: chest x-ray: right lower lobe infiltrate   NO NEW EXAMS.   LABS REVIEWED PREVIOUS:   09-01-18: wbc 15.1; hgb 12.3; hct 38.6; mcv 91.9; plt 212 glucose 159; bun 33; creat 1.66; k+ 4.0; na++ 139; ca 9.3 liver normal albumin 3.8  09-02-18: wbc 8.1; hgb 10.9; hct 34.1; mcv 92.2; plt 174; glucose 113; bun 42; creat 2.09; k+ 4.1; na++ 139; ca 8.4 hgb a1c 6.2  09-03-18: glucose 122; bun 35; creat 1.17; k+ 4.4; na++ 135; ca 8.4  09-05-18: wbc 12.0; hgb 10.1; hct 30.8; mcv 90.1 plt 153 UA: neg 09-06-18: blood cultures: no growth   NO NEW LABS.    Review of Systems  Constitutional: Negative for malaise/fatigue.  Respiratory: Negative for cough and shortness of breath.   Cardiovascular: Negative for chest pain, palpitations and leg swelling.  Gastrointestinal: Negative for abdominal pain, constipation and heartburn.  Musculoskeletal: Positive for joint pain. Negative for back pain and myalgias.       Right hip pain managed   Skin: Negative.   Neurological: Negative for dizziness.  Psychiatric/Behavioral: The patient is not nervous/anxious.     Physical Exam Constitutional:      General: She is not in acute distress.    Appearance: She is  well-developed. She is obese. She is not diaphoretic.  Neck:     Musculoskeletal: Neck supple.     Thyroid: No thyromegaly.  Cardiovascular:     Rate and Rhythm: Normal rate and regular rhythm.     Pulses: Normal pulses.     Heart sounds: Normal heart sounds.  Pulmonary:     Effort: Pulmonary effort is normal. No respiratory distress.     Breath sounds: Normal breath sounds.  Abdominal:     General: Bowel sounds are normal. There is no distension.     Palpations: Abdomen is  soft.     Tenderness: There is no abdominal tenderness.  Musculoskeletal: Normal range of motion.     Right lower leg: No edema.     Left lower leg: No edema.     Comments:  Is able to move all extremities  Is status post bilateral fracture of superior and inferior pubic ramus       Lymphadenopathy:     Cervical: No cervical adenopathy.  Skin:    General: Skin is warm and dry.  Neurological:     Mental Status: She is alert and oriented to person, place, and time.  Psychiatric:        Mood and Affect: Mood normal.     ASSESSMENT/ PLAN:  TODAY ;  1. Multiple closed fracture of pelvis with stable disruption of pelvic circle sequela: stable will continue therapy as directed and will follow up with orthopedics; will continue percocet 5/325 mg every 8 hours as needed  2. Essential hypertension: is stable b/p 122/77 will continue asa 81 mg daily   3. CKD stage 3 due to type 2 diabetes mellitus: is stable bun 35; creat 1.17  4. Right lower lobe pneumonia: is stable will complete levquin and will monitor her status.   PREVIOUS  5. Type 2 diabetes mellitus with stage 3 chronic kidney disease without long term current use of insulin: is stable hgb a1c 6.2 will continue asa 81 mg daily metformin 500 mg twice daily  is on statin  6. Dyslipidemia associated with type 2 diabetes mellitus: is stable will continue pravachol 80 mg daily   7. Chronic non-seasonal allergic rhinitis: is stable will continue zyrtec 10  mg daily   8. Chronic depression: is stable will continue prozac 40 mg daily      MD is aware of resident's narcotic use and is in agreement with current plan of care. We will attempt to wean resident as apropriate   Ok Edwards NP [facility]  Contact [phone] Monday through Friday 8am- 5pm  After hours call [phone]

## 2018-09-12 ENCOUNTER — Non-Acute Institutional Stay (SKILLED_NURSING_FACILITY): Payer: Medicare Other | Admitting: Adult Health

## 2018-09-12 ENCOUNTER — Encounter: Payer: Self-pay | Admitting: Adult Health

## 2018-09-12 DIAGNOSIS — S32810S Multiple fractures of pelvis with stable disruption of pelvic ring, sequela: Secondary | ICD-10-CM

## 2018-09-12 NOTE — Progress Notes (Signed)
Location:   [place] Room Number: 159 P Place of Service:  SNF (31)   CODE STATUS: Full Code  Allergies  Allergen Reactions  . Augmentin [Amoxicillin-Pot Clavulanate] Hives  . Codeine Palpitations    Chief Complaint  Patient presents with  . Acute Visit    Pain Management    HPI:  She has percocet ordered every 8 hours as needed for pain management. She tells me that she is taking tylenol for pain management. She denies any uncontrolled pain; no changes in her appetite. No reports of insomnia. There are no reports of fevers present.   Past Medical History:  Diagnosis Date  . Cancer ([place])    endometrial   . Diabetes mellitus without complication ([place])   . Hyperlipidemia   . Hypertension     Past Surgical History:  Procedure Laterality Date  . ABDOMINAL HYSTERECTOMY    . BREAST SURGERY      Social History   Socioeconomic History  . Marital status: Single    Spouse name: Not on file  . Number of children: Not on file  . Years of education: Not on file  . Highest education level: Not on file  Occupational History  . Not on file  Social Needs  . Financial resource strain: Not on file  . Food insecurity:    Worry: Not on file    Inability: Not on file  . Transportation needs:    Medical: Not on file    Non-medical: Not on file  Tobacco Use  . Smoking status: Never Smoker  . Smokeless tobacco: Never Used  Substance and Sexual Activity  . Alcohol use: Yes    Comment: occ  . Drug use: Never  . Sexual activity: Not on file  Lifestyle  . Physical activity:    Days per week: Not on file    Minutes per session: Not on file  . Stress: Not on file  Relationships  . Social connections:    Talks on phone: Not on file    Gets together: Not on file    Attends religious service: Not on file    Active member of club or organization: Not on file    Attends meetings of clubs or organizations: Not on file    Relationship status: Not  on file  . Intimate partner violence:    Fear of current or ex partner: Not on file    Emotionally abused: Not on file    Physically abused: Not on file    Forced sexual activity: Not on file  Other Topics Concern  . Not on file  Social History Narrative  . Not on file   History reviewed. No pertinent family history.    VITAL SIGNS BP (!) 91/57   Pulse 62   Temp (!) 97.3 F (36.3 C)   Resp 18   Ht 5\' 6"  (1.676 m)   Wt 213 lb 12.8 oz (97 kg)   BMI 34.51 kg/m   Outpatient Encounter Medications as of 09/12/2018  Medication Sig  . aspirin EC 81 MG tablet Take 81 mg by mouth every morning.  . cetirizine (ZYRTEC) 10 MG tablet Take 10 mg by mouth at bedtime.   Marland Kitchen FLUoxetine (PROZAC) 40 MG capsule Take 40 mg by mouth every morning.   . Glucerna (GLUCERNA) LIQD Take 237 mLs by mouth 2 (two) times daily between meals.  Marland Kitchen levofloxacin (LEVAQUIN) 750 MG tablet Take 750 mg by mouth every other day.  Marland Kitchen  metFORMIN (GLUCOPHAGE) 500 MG tablet Take 500 mg by mouth 2 (two) times daily.  . NON FORMULARY Diet Type: Con Carb  . oxyCODONE-acetaminophen (PERCOCET/ROXICET) 5-325 MG tablet Take 1 tablet by mouth every 8 (eight) hours as needed for up to 5 days for moderate pain.  . polyethylene glycol (MIRALAX / GLYCOLAX) 17 g packet Take 17 g by mouth daily.  . pravastatin (PRAVACHOL) 80 MG tablet Take 80 mg by mouth every evening.   . Probiotic Product (RISA-BID PROBIOTIC) TABS Take 1 tablet by mouth 2 (two) times a day.   No facility-administered encounter medications on file as of 09/12/2018.      SIGNIFICANT DIAGNOSTIC EXAMS   PREVIOUS:   09-01-18: ct of head and cervical spine:  1. No acute intracranial findings and no acute cervical spine findings. 2. Atherosclerosis. 3. Mild chronic right frontal sinusitis. 4. Cervical spondylosis and degenerative disc disease with suspected impingement at C4-5, C5-6, and C6-7 due to spurring.  09-01-18: pelvic x-ray: Bilateral minimally displaced  fractures of the superior and inferior pubic ramus.  09-01-18: lumbar spine x-ray: Degenerative change without acute abnormality. Cholelithiasis in the upper quadrant.  09-01-18: chest x-ray: No active cardiopulmonary disease.  09-01-18: ct of pelvis:Superior and inferior pubic rami fractures bilaterally as well as mildly displaced right sacral fracture.   09-02-18: renal ultrasound:1. No hydronephrosis. 2. Nonobstructing RIGHT renal calculi. 3. Distended bladder.   09-06-18: chest x-ray: right lower lobe infiltrate   NO NEW EXAMS.   LABS REVIEWED PREVIOUS:   09-01-18: wbc 15.1; hgb 12.3; hct 38.6; mcv 91.9; plt 212 glucose 159; bun 33; creat 1.66; k+ 4.0; na++ 139; ca 9.3 liver normal albumin 3.8  09-02-18: wbc 8.1; hgb 10.9; hct 34.1; mcv 92.2; plt 174; glucose 113; bun 42; creat 2.09; k+ 4.1; na++ 139; ca 8.4 hgb a1c 6.2  09-03-18: glucose 122; bun 35; creat 1.17; k+ 4.4; na++ 135; ca 8.4  09-05-18: wbc 12.0; hgb 10.1; hct 30.8; mcv 90.1 plt 153 UA: neg 09-06-18: blood cultures: no growth   NO NEW LABS.   Review of Systems  Constitutional: Negative for malaise/fatigue.  Respiratory: Negative for cough and shortness of breath.   Cardiovascular: Negative for chest pain, palpitations and leg swelling.  Gastrointestinal: Negative for abdominal pain, constipation and heartburn.  Musculoskeletal: Negative for back pain, joint pain and myalgias.  Skin: Negative.   Neurological: Negative for dizziness.  Psychiatric/Behavioral: The patient is not nervous/anxious.     Physical Exam Constitutional:      General: She is not in acute distress.    Appearance: She is well-developed. She is obese. She is not diaphoretic.  Neck:     Musculoskeletal: Neck supple.     Thyroid: No thyromegaly.  Cardiovascular:     Rate and Rhythm: Normal rate and regular rhythm.     Pulses: Normal pulses.     Heart sounds: Normal heart sounds.  Pulmonary:     Effort: Pulmonary effort is normal. No respiratory  distress.     Breath sounds: Normal breath sounds.  Abdominal:     General: Bowel sounds are normal. There is no distension.     Palpations: Abdomen is soft.     Tenderness: There is no abdominal tenderness.  Musculoskeletal:     Right lower leg: No edema.     Left lower leg: No edema.     Comments: Is able to move all extremities  Is status post bilateral fracture of superior and inferior pubic ramus     Lymphadenopathy:  Cervical: No cervical adenopathy.  Skin:    General: Skin is warm and dry.  Neurological:     Mental Status: She is alert and oriented to person, place, and time.  Psychiatric:        Mood and Affect: Mood normal.      ASSESSMENT/ PLAN:  TODAY  1. Multiple closed fractures of pelvis with stable disruption of pelvic ring sequela: will keep prn percocet for 5 more days will stop if she does not use will stop at that time.   MD is aware of resident's narcotic use and is in agreement with current plan of care. We will attempt to wean resident as apropriate   Ok Edwards NP [facility]  Contact [phone] Monday through Friday 8am- 5pm  After hours call [phone]

## 2018-09-13 ENCOUNTER — Other Ambulatory Visit (HOSPITAL_COMMUNITY)
Admission: AD | Admit: 2018-09-13 | Discharge: 2018-09-13 | Disposition: A | Discharge status: Home / Self Care | Payer: Medicare Other | Source: Skilled Nursing Facility | Attending: Internal Medicine | Admitting: Internal Medicine

## 2018-09-13 DIAGNOSIS — Z1159 Encounter for screening for other viral diseases: Secondary | ICD-10-CM | POA: Insufficient documentation

## 2018-09-13 LAB — SARS CORONAVIRUS 2 BY RT PCR (HOSPITAL ORDER, PERFORMED IN ~~LOC~~ HOSPITAL LAB): SARS Coronavirus 2: NEGATIVE

## 2018-09-17 ENCOUNTER — Encounter (HOSPITAL_COMMUNITY)
Admission: RE | Admit: 2018-09-17 | Discharge: 2018-09-17 | Disposition: A | Discharge status: Home / Self Care | Payer: Medicare Other | Source: Skilled Nursing Facility | Attending: Internal Medicine | Admitting: Internal Medicine

## 2018-09-17 DIAGNOSIS — N183 Chronic kidney disease, stage 3 (moderate): Secondary | ICD-10-CM | POA: Insufficient documentation

## 2018-09-17 LAB — CBC WITH DIFFERENTIAL/PLATELET
Abs Immature Granulocytes: 0.07 10*3/uL (ref 0.00–0.07)
Abs Immature Granulocytes: 0.05 10*3/uL (ref 0.00–0.07)
Basophils Absolute: 0 10*3/uL (ref 0.0–0.1)
Basophils Relative: 0 %
Eosinophils Absolute: 0.2 10*3/uL (ref 0.0–0.5)
Eosinophils Relative: 2 %
HCT: 31.8 % — ABNORMAL LOW (ref 36.0–46.0)
Hemoglobin: 9.9 g/dL — ABNORMAL LOW (ref 12.0–15.0)
Immature Granulocytes: 1 %
Immature Granulocytes: 1 %
Lymphocytes Relative: 16 %
Lymphs Abs: 1.5 10*3/uL (ref 0.7–4.0)
MCH: 28.6 pg (ref 26.0–34.0)
MCHC: 31.1 g/dL (ref 30.0–36.0)
MCV: 91.9 fL (ref 80.0–100.0)
Monocytes Absolute: 0.5 10*3/uL (ref 0.1–1.0)
Monocytes Relative: 6 %
Neutro Abs: 7.2 10*3/uL (ref 1.7–7.7)
Neutrophils Relative %: 75 %
Platelets: 554 10*3/uL — ABNORMAL HIGH (ref 150–400)
RBC: 3.46 MIL/uL — ABNORMAL LOW (ref 3.87–5.11)
RDW: 13.3 % (ref 11.5–15.5)
WBC: 9.4 10*3/uL (ref 4.0–10.5)
nRBC: 0 % (ref 0.0–0.2)

## 2018-09-17 LAB — BASIC METABOLIC PANEL
Anion gap: 12 (ref 5–15)
BUN: 22 mg/dL (ref 8–23)
CO2: 25 mmol/L (ref 22–32)
Calcium: 8.9 mg/dL (ref 8.9–10.3)
Chloride: 100 mmol/L (ref 98–111)
Creatinine, Ser: 1.29 mg/dL — ABNORMAL HIGH (ref 0.44–1.00)
GFR calc Af Amer: 48 mL/min — ABNORMAL LOW (ref >60)
GFR calc non Af Amer: 42 mL/min — ABNORMAL LOW (ref >60)
Glucose, Bld: 112 mg/dL — ABNORMAL HIGH (ref 70–99)
Potassium: 4.6 mmol/L (ref 3.5–5.1)
Sodium: 137 mmol/L (ref 135–145)

## 2018-09-18 ENCOUNTER — Encounter: Payer: Self-pay | Admitting: Adult Health

## 2018-09-18 ENCOUNTER — Non-Acute Institutional Stay (SKILLED_NURSING_FACILITY): Payer: Medicare Other | Admitting: Adult Health

## 2018-09-18 DIAGNOSIS — E1169 Type 2 diabetes mellitus with other specified complication: Secondary | ICD-10-CM | POA: Diagnosis not present

## 2018-09-18 DIAGNOSIS — N183 Chronic kidney disease, stage 3 unspecified: Secondary | ICD-10-CM

## 2018-09-18 DIAGNOSIS — E785 Hyperlipidemia, unspecified: Secondary | ICD-10-CM | POA: Diagnosis not present

## 2018-09-18 DIAGNOSIS — S32810S Multiple fractures of pelvis with stable disruption of pelvic ring, sequela: Secondary | ICD-10-CM

## 2018-09-18 DIAGNOSIS — E1122 Type 2 diabetes mellitus with diabetic chronic kidney disease: Secondary | ICD-10-CM | POA: Diagnosis not present

## 2018-09-18 NOTE — Progress Notes (Signed)
Location:   [place] Room Number: 144 P Place of Service:  SNF (31)   CODE STATUS: Full Code  Allergies  Allergen Reactions  . Augmentin [Amoxicillin-Pot Clavulanate] Hives  . Codeine Palpitations    Chief Complaint  Patient presents with  . Medical Management of Chronic Issues    Dyslipidemia associated with type 2 diabetes mellitus; type 2 diabetes mellitus with stage 3 chronic kidney disease without long term current use of insulin; multiple closed fractures of pelvis with stable disruption of pelvic ring sequela. Weekly follow up for the first 30 days post hospitalization     HPI:  She is a 73 year old short term rehab patient being seen for the management of her chronic illnesses: dyslipidemia; diabetes pelvic fractures. Her pain is being managed is taking percocet one time daily at this time. She denies any changes in her appetite. There are no reports of fevers present.   Past Medical History:  Diagnosis Date  . Cancer ([place])    endometrial   . Diabetes mellitus without complication ([place])   . Hyperlipidemia   . Hypertension     Past Surgical History:  Procedure Laterality Date  . ABDOMINAL HYSTERECTOMY    . BREAST SURGERY      Social History   Socioeconomic History  . Marital status: Single    Spouse name: Not on file  . Number of children: Not on file  . Years of education: Not on file  . Highest education level: Not on file  Occupational History  . Not on file  Social Needs  . Financial resource strain: Not on file  . Food insecurity:    Worry: Not on file    Inability: Not on file  . Transportation needs:    Medical: Not on file    Non-medical: Not on file  Tobacco Use  . Smoking status: Never Smoker  . Smokeless tobacco: Never Used  Substance and Sexual Activity  . Alcohol use: Yes    Comment: occ  . Drug use: Never  . Sexual activity: Not on file  Lifestyle  . Physical activity:    Days per week: Not on file     Minutes per session: Not on file  . Stress: Not on file  Relationships  . Social connections:    Talks on phone: Not on file    Gets together: Not on file    Attends religious service: Not on file    Active member of club or organization: Not on file    Attends meetings of clubs or organizations: Not on file    Relationship status: Not on file  . Intimate partner violence:    Fear of current or ex partner: Not on file    Emotionally abused: Not on file    Physically abused: Not on file    Forced sexual activity: Not on file  Other Topics Concern  . Not on file  Social History Narrative  . Not on file   History reviewed. No pertinent family history.    VITAL SIGNS BP 104/66   Pulse 62   Temp 97.8 F (36.6 C)   Resp 20   Ht 5\' 6"  (1.676 m)   Wt 209 lb 3.2 oz (94.9 kg)   BMI 33.77 kg/m   Outpatient Encounter Medications as of 09/18/2018  Medication Sig  . aspirin EC 81 MG tablet Take 81 mg by mouth every morning.  . cetirizine (ZYRTEC) 10 MG tablet Take 10  mg by mouth at bedtime.   Marland Kitchen FLUoxetine (PROZAC) 40 MG capsule Take 40 mg by mouth every morning.   . Glucerna (GLUCERNA) LIQD Take 237 mLs by mouth 2 (two) times daily between meals.  . metFORMIN (GLUCOPHAGE) 500 MG tablet Take 500 mg by mouth 2 (two) times daily.  . NON FORMULARY Diet Type: Con Carb  . oxyCODONE-acetaminophen (PERCOCET/ROXICET) 5-325 MG tablet Take 1 tablet by mouth every 8 (eight) hours as needed for severe pain. For 8 - 10 / 10 pain  . polyethylene glycol (MIRALAX / GLYCOLAX) 17 g packet Take 17 g by mouth daily.  . pravastatin (PRAVACHOL) 80 MG tablet Take 80 mg by mouth every evening.    No facility-administered encounter medications on file as of 09/18/2018.      SIGNIFICANT DIAGNOSTIC EXAMS   PREVIOUS:   09-01-18: ct of head and cervical spine:  1. No acute intracranial findings and no acute cervical spine findings. 2. Atherosclerosis. 3. Mild chronic right frontal sinusitis. 4.  Cervical spondylosis and degenerative disc disease with suspected impingement at C4-5, C5-6, and C6-7 due to spurring.  09-01-18: pelvic x-ray: Bilateral minimally displaced fractures of the superior and inferior pubic ramus.  09-01-18: lumbar spine x-ray: Degenerative change without acute abnormality. Cholelithiasis in the upper quadrant.  09-01-18: chest x-ray: No active cardiopulmonary disease.  09-01-18: ct of pelvis:Superior and inferior pubic rami fractures bilaterally as well as mildly displaced right sacral fracture.   09-02-18: renal ultrasound:1. No hydronephrosis. 2. Nonobstructing RIGHT renal calculi. 3. Distended bladder.   09-06-18: chest x-ray: right lower lobe infiltrate   NO NEW EXAMS.   LABS REVIEWED PREVIOUS:   09-01-18: wbc 15.1; hgb 12.3; hct 38.6; mcv 91.9; plt 212 glucose 159; bun 33; creat 1.66; k+ 4.0; na++ 139; ca 9.3 liver normal albumin 3.8  09-02-18: wbc 8.1; hgb 10.9; hct 34.1; mcv 92.2; plt 174; glucose 113; bun 42; creat 2.09; k+ 4.1; na++ 139; ca 8.4 hgb a1c 6.2  09-03-18: glucose 122; bun 35; creat 1.17; k+ 4.4; na++ 135; ca 8.4  09-05-18: wbc 12.0; hgb 10.1; hct 30.8; mcv 90.1 plt 153 UA: neg 09-06-18: blood cultures: no growth   TODAY:   09-17-18: wbc 12.0; hgb 10.1; hct 30.8; mcv 90.1 plt 153 glucose 112; bun 22; creat 1.29; k+ 4.6; na++ 137; ca 8.9   Review of Systems  Constitutional: Negative for malaise/fatigue.  Respiratory: Negative for cough and shortness of breath.   Cardiovascular: Negative for chest pain, palpitations and leg swelling.  Gastrointestinal: Negative for abdominal pain, constipation and heartburn.  Musculoskeletal: Positive for joint pain. Negative for back pain and myalgias.       Tail bone pain is managed   Skin: Negative.   Neurological: Negative for dizziness.  Psychiatric/Behavioral: The patient is not nervous/anxious.      Physical Exam Constitutional:      General: She is not in acute distress.    Appearance: She is  well-developed. She is obese. She is not diaphoretic.  Neck:     Musculoskeletal: Neck supple.     Thyroid: No thyromegaly.  Cardiovascular:     Rate and Rhythm: Normal rate and regular rhythm.     Pulses: Normal pulses.     Heart sounds: Normal heart sounds.  Pulmonary:     Effort: Pulmonary effort is normal. No respiratory distress.     Breath sounds: Normal breath sounds.  Abdominal:     General: Bowel sounds are normal. There is no distension.  Palpations: Abdomen is soft.     Tenderness: There is no abdominal tenderness.  Musculoskeletal:     Right lower leg: No edema.     Left lower leg: No edema.     Comments:  Is able to move all extremities  Is status post bilateral fracture of superior and inferior pubic ramus  Lymphadenopathy:     Cervical: No cervical adenopathy.  Skin:    General: Skin is warm and dry.  Neurological:     Mental Status: She is alert and oriented to person, place, and time.  Psychiatric:        Mood and Affect: Mood normal.      ASSESSMENT/ PLAN:  TODAY ;  1. Multiple closed fracture of pelvis with stable disruption of pelvic circle sequela stable will continue therapy as directed and will follow up with orthopedics. Will continue percocet 5/325 mg every 8 hours as needed   2. Type 2 diabetes mellitus with stage 3 chronic kidney disease without long term current use of insulin: is stable hgb a1c 6.2 will continue metformin 500 mg twice daily is on asa and statin.   3. Dyslipidemia associated with type 2 diabetes mellitus: is stable will continue pravachol 80 mg daily    PREVIOUS  4. Chronic non-seasonal allergic rhinitis: is stable will continue zyrtec 10 mg daily   5. Chronic depression: is stable will continue prozac 40 mg daily   6. Essential hypertension: is stable b/p 104/66 will continue asa 81 mg daily   7. CKD stage 3 due to type 2 diabetes mellitus: is stable bun 22; creat 1.29   MD is aware of resident's narcotic use and is  in agreement with current plan of care. We will attempt to wean resident as apropriate   Ok Edwards NP [facility]  Contact [phone] Monday through Friday 8am- 5pm  After hours call [phone]

## 2018-09-19 ENCOUNTER — Encounter: Payer: Self-pay | Admitting: Adult Health

## 2018-09-19 ENCOUNTER — Non-Acute Institutional Stay (SKILLED_NURSING_FACILITY): Payer: Medicare Other | Admitting: Adult Health

## 2018-09-19 DIAGNOSIS — I10 Essential (primary) hypertension: Secondary | ICD-10-CM

## 2018-09-19 DIAGNOSIS — E1169 Type 2 diabetes mellitus with other specified complication: Secondary | ICD-10-CM

## 2018-09-19 DIAGNOSIS — S32810S Multiple fractures of pelvis with stable disruption of pelvic ring, sequela: Secondary | ICD-10-CM

## 2018-09-19 DIAGNOSIS — E785 Hyperlipidemia, unspecified: Secondary | ICD-10-CM | POA: Diagnosis not present

## 2018-09-19 NOTE — Progress Notes (Signed)
Location:   [place] Room Number: 144 P Place of Service:  SNF (31)   CODE STATUS: Full Code  Allergies  Allergen Reactions  . Augmentin [Amoxicillin-Pot Clavulanate] Hives  . Codeine Palpitations    Chief Complaint  Patient presents with  . Acute Visit    Care Plan Meeting    HPI:  We have come together for her routine care plan meeting. She is doing well in therapy and will more than likely be going home within this week. She denies any changes in her appetite; no anxiety or depressive thoughts; no uncontrolled pain. She continues to be followed for her chronic illnesses including: hypertension; dyslipidemia; pelvic fractures.   Past Medical History:  Diagnosis Date  . Cancer ([place])    endometrial   . Diabetes mellitus without complication ([place])   . Hyperlipidemia   . Hypertension     Past Surgical History:  Procedure Laterality Date  . ABDOMINAL HYSTERECTOMY    . BREAST SURGERY      Social History   Socioeconomic History  . Marital status: Single    Spouse name: Not on file  . Number of children: Not on file  . Years of education: Not on file  . Highest education level: Not on file  Occupational History  . Not on file  Social Needs  . Financial resource strain: Not on file  . Food insecurity:    Worry: Not on file    Inability: Not on file  . Transportation needs:    Medical: Not on file    Non-medical: Not on file  Tobacco Use  . Smoking status: Never Smoker  . Smokeless tobacco: Never Used  Substance and Sexual Activity  . Alcohol use: Yes    Comment: occ  . Drug use: Never  . Sexual activity: Not on file  Lifestyle  . Physical activity:    Days per week: Not on file    Minutes per session: Not on file  . Stress: Not on file  Relationships  . Social connections:    Talks on phone: Not on file    Gets together: Not on file    Attends religious service: Not on file    Active member of club or organization: Not  on file    Attends meetings of clubs or organizations: Not on file    Relationship status: Not on file  . Intimate partner violence:    Fear of current or ex partner: Not on file    Emotionally abused: Not on file    Physically abused: Not on file    Forced sexual activity: Not on file  Other Topics Concern  . Not on file  Social History Narrative  . Not on file   History reviewed. No pertinent family history.    VITAL SIGNS BP 140/77   Pulse 79   Temp 98.2 F (36.8 C)   Resp 20   Ht 5\' 6"  (1.676 m)   Wt 209 lb 3.2 oz (94.9 kg)   BMI 33.77 kg/m   Outpatient Encounter Medications as of 09/19/2018  Medication Sig  . aspirin EC 81 MG tablet Take 81 mg by mouth every morning.  . cetirizine (ZYRTEC) 10 MG tablet Take 10 mg by mouth at bedtime.   Marland Kitchen FLUoxetine (PROZAC) 40 MG capsule Take 40 mg by mouth every morning.   . Glucerna (GLUCERNA) LIQD Take 237 mLs by mouth 2 (two) times daily between meals.  . metFORMIN (GLUCOPHAGE) 500  MG tablet Take 500 mg by mouth 2 (two) times daily.  . NON FORMULARY Diet Type: Con Carb  . oxyCODONE-acetaminophen (PERCOCET/ROXICET) 5-325 MG tablet Take 1 tablet by mouth every 8 (eight) hours as needed for severe pain. For 8 - 10 / 10 pain  . polyethylene glycol (MIRALAX / GLYCOLAX) 17 g packet Take 17 g by mouth daily.  . pravastatin (PRAVACHOL) 80 MG tablet Take 80 mg by mouth every evening.    No facility-administered encounter medications on file as of 09/19/2018.      SIGNIFICANT DIAGNOSTIC EXAMS    PREVIOUS:   09-01-18: ct of head and cervical spine:  1. No acute intracranial findings and no acute cervical spine findings. 2. Atherosclerosis. 3. Mild chronic right frontal sinusitis. 4. Cervical spondylosis and degenerative disc disease with suspected impingement at C4-5, C5-6, and C6-7 due to spurring.  09-01-18: pelvic x-ray: Bilateral minimally displaced fractures of the superior and inferior pubic ramus.  09-01-18: lumbar spine x-ray:  Degenerative change without acute abnormality. Cholelithiasis in the upper quadrant.  09-01-18: chest x-ray: No active cardiopulmonary disease.  09-01-18: ct of pelvis:Superior and inferior pubic rami fractures bilaterally as well as mildly displaced right sacral fracture.   09-02-18: renal ultrasound:1. No hydronephrosis. 2. Nonobstructing RIGHT renal calculi. 3. Distended bladder.   09-06-18: chest x-ray: right lower lobe infiltrate   NO NEW EXAMS.   LABS REVIEWED PREVIOUS:   09-01-18: wbc 15.1; hgb 12.3; hct 38.6; mcv 91.9; plt 212 glucose 159; bun 33; creat 1.66; k+ 4.0; na++ 139; ca 9.3 liver normal albumin 3.8  09-02-18: wbc 8.1; hgb 10.9; hct 34.1; mcv 92.2; plt 174; glucose 113; bun 42; creat 2.09; k+ 4.1; na++ 139; ca 8.4 hgb a1c 6.2  09-03-18: glucose 122; bun 35; creat 1.17; k+ 4.4; na++ 135; ca 8.4  09-05-18: wbc 12.0; hgb 10.1; hct 30.8; mcv 90.1 plt 153 UA: neg 09-06-18: blood cultures: no growth  09-17-18: wbc 12.0; hgb 10.1; hct 30.8; mcv 90.1 plt 153 glucose 112; bun 22; creat 1.29; k+ 4.6; na++ 137; ca 8.9   NO NEW LABS.   Review of Systems  Constitutional: Negative for malaise/fatigue.  Respiratory: Negative for cough and shortness of breath.   Cardiovascular: Negative for chest pain, palpitations and leg swelling.  Gastrointestinal: Negative for abdominal pain, constipation and heartburn.  Musculoskeletal: Negative for back pain, joint pain and myalgias.  Skin: Negative.   Neurological: Negative for dizziness.  Psychiatric/Behavioral: The patient is not nervous/anxious.       Physical Exam Constitutional:      General: She is not in acute distress.    Appearance: She is well-developed. She is obese. She is not diaphoretic.  Neck:     Musculoskeletal: Neck supple.     Thyroid: No thyromegaly.  Cardiovascular:     Rate and Rhythm: Normal rate and regular rhythm.     Pulses: Normal pulses.     Heart sounds: Normal heart sounds.  Pulmonary:     Effort: Pulmonary  effort is normal. No respiratory distress.     Breath sounds: Normal breath sounds.  Abdominal:     General: Bowel sounds are normal. There is no distension.     Palpations: Abdomen is soft.     Tenderness: There is no abdominal tenderness.  Musculoskeletal:     Right lower leg: No edema.     Left lower leg: No edema.     Comments: Is able to move all extremities  Is status post bilateral fracture of superior  and inferior pubic ramus   Lymphadenopathy:     Cervical: No cervical adenopathy.  Skin:    General: Skin is warm and dry.  Neurological:     Mental Status: She is alert and oriented to person, place, and time.  Psychiatric:        Mood and Affect: Mood normal.      ASSESSMENT/ PLAN:  TODAY;   1. Essential hypertension 2. Dyslipidemia associated with type 2 diabetes mellitus 3. Multiple closed fractures of pelvis with stable disruption of pelvic ring sequela  Will continue therapy as directed Will continue medications Will continue to monitor her status  Her goal is return back home.      MD is aware of resident's narcotic use and is in agreement with current plan of care. We will attempt to wean resident as apropriate   Ok Edwards NP [facility]  Contact [phone] Monday through Friday 8am- 5pm  After hours call [phone]

## 2018-09-20 ENCOUNTER — Encounter: Payer: Self-pay | Admitting: Adult Health

## 2018-09-20 ENCOUNTER — Other Ambulatory Visit: Payer: Self-pay | Admitting: Adult Health

## 2018-09-20 ENCOUNTER — Non-Acute Institutional Stay (SKILLED_NURSING_FACILITY): Payer: Medicare Other | Admitting: Adult Health

## 2018-09-20 DIAGNOSIS — S32810S Multiple fractures of pelvis with stable disruption of pelvic ring, sequela: Secondary | ICD-10-CM

## 2018-09-20 DIAGNOSIS — N183 Chronic kidney disease, stage 3 unspecified: Secondary | ICD-10-CM

## 2018-09-20 DIAGNOSIS — E1122 Type 2 diabetes mellitus with diabetic chronic kidney disease: Secondary | ICD-10-CM

## 2018-09-20 MED ORDER — PRAVASTATIN SODIUM 80 MG PO TABS: 80.0000 mg | ORAL_TABLET | Freq: Every evening | ORAL | 0 refills | Status: AC

## 2018-09-20 MED ORDER — OXYCODONE-ACETAMINOPHEN 5-325 MG PO TABS: 1.0000 | ORAL_TABLET | Freq: Three times a day (TID) | ORAL | 0 refills | Status: AC | PRN

## 2018-09-20 MED ORDER — METFORMIN HCL 500 MG PO TABS: 500.0000 mg | ORAL_TABLET | Freq: Two times a day (BID) | ORAL | 0 refills | Status: AC

## 2018-09-20 MED ORDER — FLUOXETINE HCL 40 MG PO CAPS: 40.0000 mg | ORAL_CAPSULE | Freq: Every morning | ORAL | 0 refills | Status: AC

## 2018-09-20 NOTE — Progress Notes (Signed)
Location:   [facility] Room Number: 144 P Place of Service:  SNF (31)    CODE STATUS: Full Code  Allergies  Allergen Reactions   Augmentin [Amoxicillin-Pot Clavulanate] Hives   Codeine Palpitations    Chief Complaint  Patient presents with   Discharge Note    Discharging to home on 09/21/2018    HPI:  She  is being discharged to home with home health for pt/ot. She  will need a front wheel walker. She will need her prescriptions written and will need to follow up with her medical provider.  She had been hospitalized after falling off a a horse and suffered multple pelvic fractures. She was admitted to this facility for short term rehab and is now ready to complete therapy on a home health basis.    Past Medical History:  Diagnosis Date   Cancer ([place])    endometrial    Diabetes mellitus without complication ([place])    Hyperlipidemia    Hypertension     Past Surgical History:  Procedure Laterality Date   ABDOMINAL HYSTERECTOMY     BREAST SURGERY      Social History   Socioeconomic History   Marital status: Single    Spouse name: Not on file   Number of children: Not on file   Years of education: Not on file   Highest education level: Not on file  Occupational History   Not on file  Social Needs   Financial resource strain: Not on file   Food insecurity:    Worry: Not on file    Inability: Not on file   Transportation needs:    Medical: Not on file    Non-medical: Not on file  Tobacco Use   Smoking status: Never Smoker   Smokeless tobacco: Never Used  Substance and Sexual Activity   Alcohol use: Yes    Comment: occ   Drug use: Never   Sexual activity: Not on file  Lifestyle   Physical activity:    Days per week: Not on file    Minutes per session: Not on file   Stress: Not on file  Relationships   Social connections:    Talks on phone: Not on file    Gets together: Not on file    Attends  religious service: Not on file    Active member of club or organization: Not on file    Attends meetings of clubs or organizations: Not on file    Relationship status: Not on file   Intimate partner violence:    Fear of current or ex partner: Not on file    Emotionally abused: Not on file    Physically abused: Not on file    Forced sexual activity: Not on file  Other Topics Concern   Not on file  Social History Narrative   Not on file   History reviewed. No pertinent family history.  VITAL SIGNS BP 124/71    Pulse 65    Temp 98.1 F (36.7 C)    Resp 20    Ht 5\' 6"  (1.676 m)    Wt 209 lb 3.2 oz (94.9 kg)    BMI 33.77 kg/m   Patient's Medications  New Prescriptions   No medications on file  Previous Medications   ASPIRIN EC 81 MG TABLET    Take 81 mg by mouth every morning.   CETIRIZINE (ZYRTEC) 10 MG TABLET    Take 10 mg by mouth at bedtime.  FLUOXETINE (PROZAC) 40 MG CAPSULE    Take 40 mg by mouth every morning.    GLUCERNA (GLUCERNA) LIQD    Take 237 mLs by mouth 2 (two) times daily between meals.   METFORMIN (GLUCOPHAGE) 500 MG TABLET    Take 500 mg by mouth 2 (two) times daily.   NON FORMULARY    Diet Type: Con Carb   OXYCODONE-ACETAMINOPHEN (PERCOCET/ROXICET) 5-325 MG TABLET    Take 1 tablet by mouth every 8 (eight) hours as needed for severe pain. For 8 - 10 / 10 pain   POLYETHYLENE GLYCOL (MIRALAX / GLYCOLAX) 17 G PACKET    Take 17 g by mouth daily.   PRAVASTATIN (PRAVACHOL) 80 MG TABLET    Take 80 mg by mouth every evening.   Modified Medications   No medications on file  Discontinued Medications   No medications on file     SIGNIFICANT DIAGNOSTIC EXAMS    PREVIOUS:   09-01-18: ct of head and cervical spine:  1. No acute intracranial findings and no acute cervical spine findings. 2. Atherosclerosis. 3. Mild chronic right frontal sinusitis. 4. Cervical spondylosis and degenerative disc disease with suspected impingement at C4-5, C5-6, and C6-7 due to  spurring.  09-01-18: pelvic x-ray: Bilateral minimally displaced fractures of the superior and inferior pubic ramus.  09-01-18: lumbar spine x-ray: Degenerative change without acute abnormality. Cholelithiasis in the upper quadrant.  09-01-18: chest x-ray: No active cardiopulmonary disease.  09-01-18: ct of pelvis:Superior and inferior pubic rami fractures bilaterally as well as mildly displaced right sacral fracture.   09-02-18: renal ultrasound:1. No hydronephrosis. 2. Nonobstructing RIGHT renal calculi. 3. Distended bladder.   09-06-18: chest x-ray: right lower lobe infiltrate   NO NEW EXAMS.   LABS REVIEWED PREVIOUS:   09-01-18: wbc 15.1; hgb 12.3; hct 38.6; mcv 91.9; plt 212 glucose 159; bun 33; creat 1.66; k+ 4.0; na++ 139; ca 9.3 liver normal albumin 3.8  09-02-18: wbc 8.1; hgb 10.9; hct 34.1; mcv 92.2; plt 174; glucose 113; bun 42; creat 2.09; k+ 4.1; na++ 139; ca 8.4 hgb a1c 6.2  09-03-18: glucose 122; bun 35; creat 1.17; k+ 4.4; na++ 135; ca 8.4  09-05-18: wbc 12.0; hgb 10.1; hct 30.8; mcv 90.1 plt 153 UA: neg 09-06-18: blood cultures: no growth  09-17-18: wbc 12.0; hgb 10.1; hct 30.8; mcv 90.1 plt 153 glucose 112; bun 22; creat 1.29; k+ 4.6; na++ 137; ca 8.9   NO NEW LABS.   Review of Systems  Constitutional: Negative for malaise/fatigue.  Respiratory: Negative for cough and shortness of breath.   Cardiovascular: Negative for chest pain, palpitations and leg swelling.  Gastrointestinal: Negative for abdominal pain, constipation and heartburn.  Musculoskeletal: Negative for back pain, joint pain and myalgias.  Skin: Negative.   Neurological: Negative for dizziness.  Psychiatric/Behavioral: The patient is not nervous/anxious.      Physical Exam Constitutional:      General: She is not in acute distress.    Appearance: She is obese. She is not diaphoretic.  Neck:     Musculoskeletal: Neck supple.     Thyroid: No thyromegaly.  Cardiovascular:     Rate and Rhythm: Normal rate and  regular rhythm.     Pulses: Normal pulses.     Heart sounds: Normal heart sounds.  Pulmonary:     Effort: Pulmonary effort is normal. No respiratory distress.     Breath sounds: Normal breath sounds.  Abdominal:     General: Bowel sounds are normal. There is no distension.  Palpations: Abdomen is soft.     Tenderness: There is no abdominal tenderness.  Musculoskeletal: Normal range of motion.     Right lower leg: No edema.     Left lower leg: No edema.     Comments: Is able to move all extremities  Is status post bilateral fracture of superior and inferior pubic ramus   Lymphadenopathy:     Cervical: No cervical adenopathy.  Skin:    General: Skin is warm and dry.  Neurological:     Mental Status: She is alert and oriented to person, place, and time.  Psychiatric:        Mood and Affect: Mood normal.       ASSESSMENT/ PLAN:  Patient is being discharged with the following home health services:  Pt/ot: to evaluate and treat as indicated for gait balance strength adl training  Patient is being discharged with the following durable medical equipment:  Front wheel walker to allow her to maintain her current level of independence with her adls.   Patient has been advised to f/u with their PCP in 1-2 weeks to bring them up to date on their rehab stay.  Social services at facility was responsible for arranging this appointment.  Pt was provided with a 30 day supply of prescriptions for medications and refills must be obtained from their PCP.  For controlled substances, a more limited supply may be provided adequate until PCP appointment only.  A 30 day supply of her prescription medications with #10 percocet 5/325 mg tabs sent to [facility].    Time spent with patient 35 minutes: home health needs; medications; dme,    Ok Edwards NP [facility]  Contact [phone] Monday through Friday 8am- 5pm  After hours call [phone]

## 2021-01-29 IMAGING — CT CT HEAD WITHOUT CONTRAST
3 of 7 series · 14 of 47 positions shown, 17 images · non-contrast
Comparison: None.

CLINICAL DATA: Fall from horse today.  Posterior head neck pain.

EXAM:
CT HEAD WITHOUT CONTRAST
CT CERVICAL SPINE WITHOUT CONTRAST
TECHNIQUE: Multidetector CT imaging of the head and cervical spine was
performed following the standard protocol without intravenous
contrast. Multiplanar CT image reconstructions of the cervical spine
were also generated.

[Series 5: coronal soft · coronal · 0.33mm/px · 3 of 69 slices shown]
[im 18/69  brain]
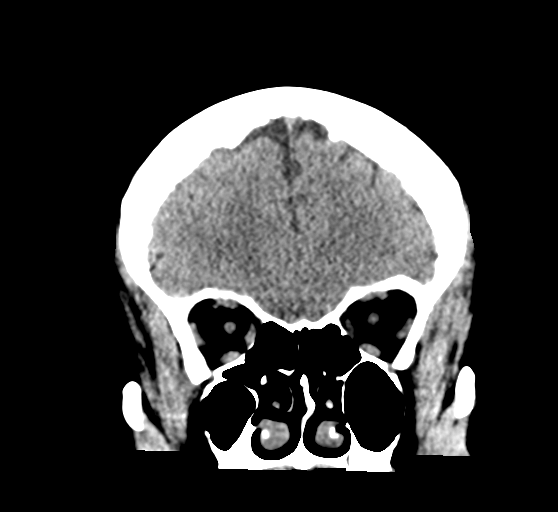
[im 35/69  brain]
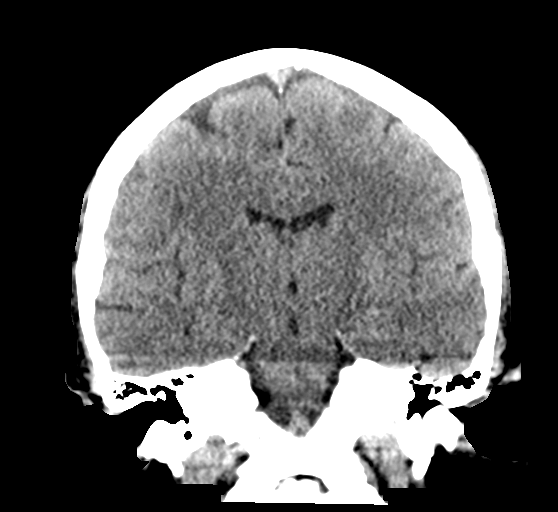
[im 52/69  brain]
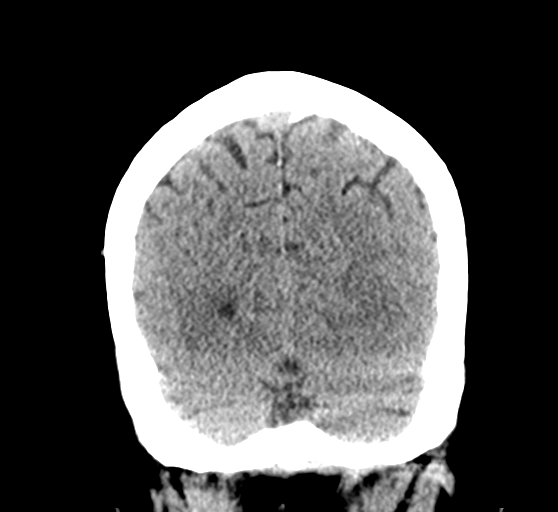

[Series 6: sagittal soft · sagittal · 0.33mm/px · 1 of 60 slices shown]
[im 30/60  brain]
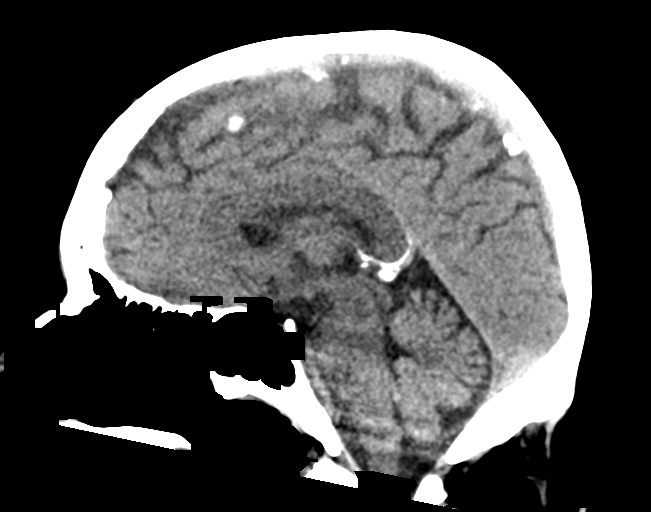

[Series 11: orthogonal axials · axial · 0.21mm/px · z∈[-147,-23]mm · 10 of 84 slices shown, 13 images]
[im 8/84  brain]
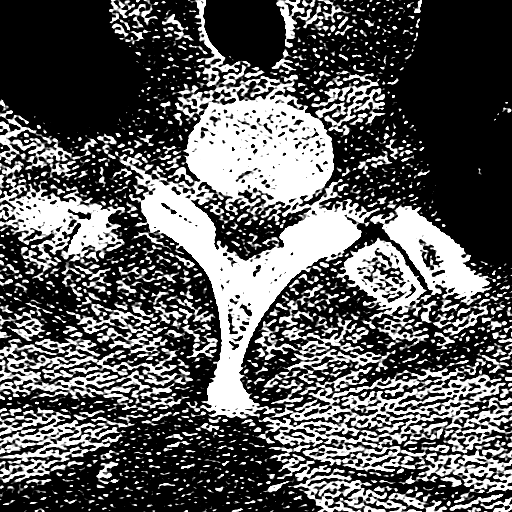
[im 8/84  bone]
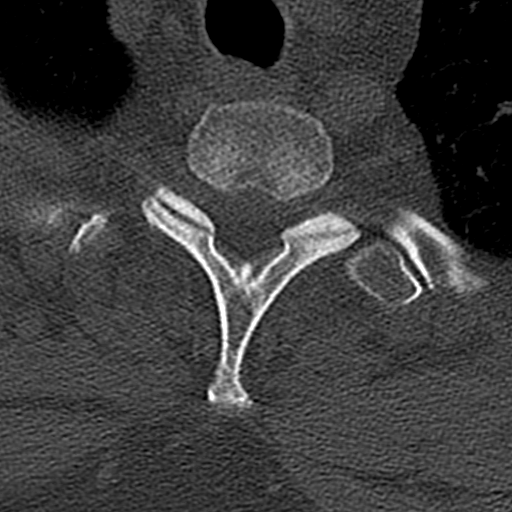
[im 16/84  brain]
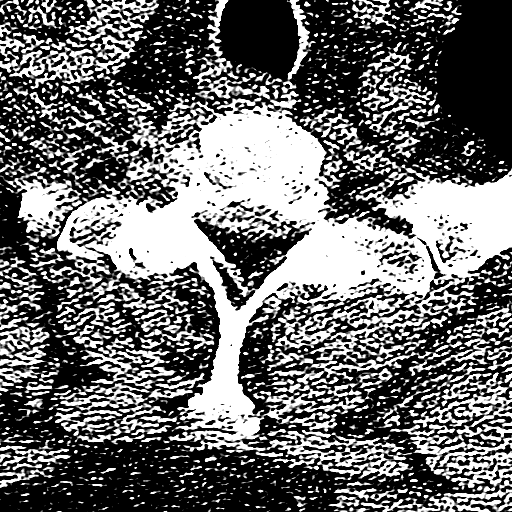
[im 23/84  brain]
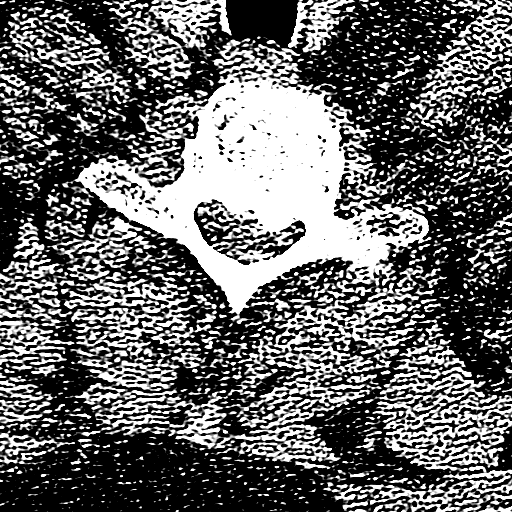
[im 31/84  brain]
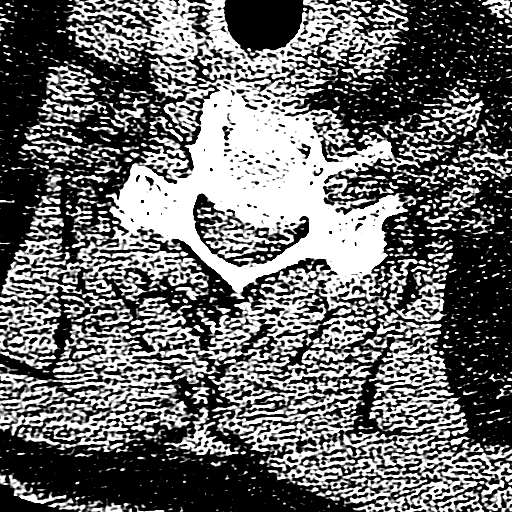
[im 38/84  brain]
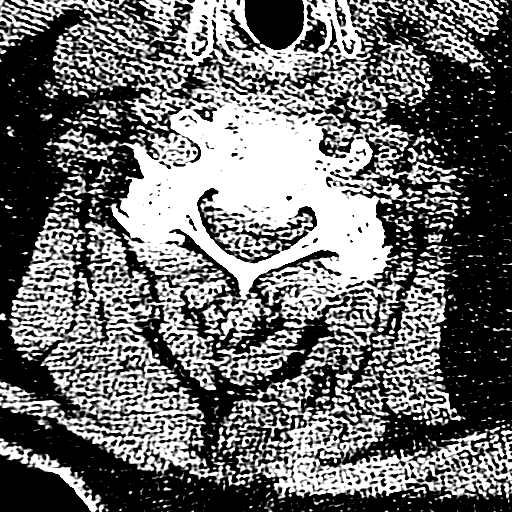
[im 38/84  bone]
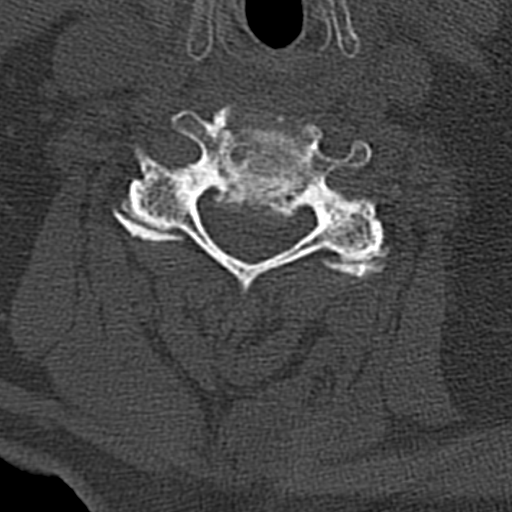
[im 46/84  brain]
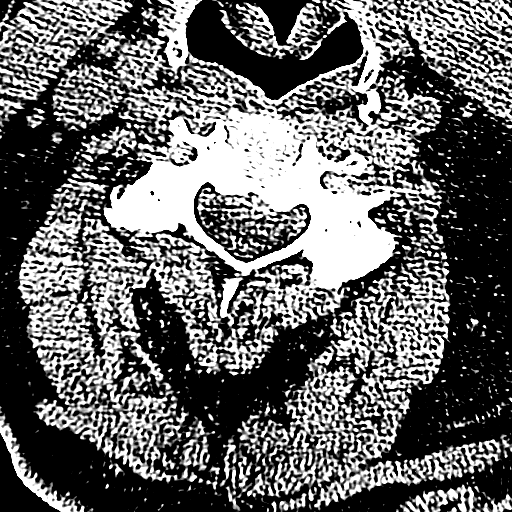
[im 53/84  brain]
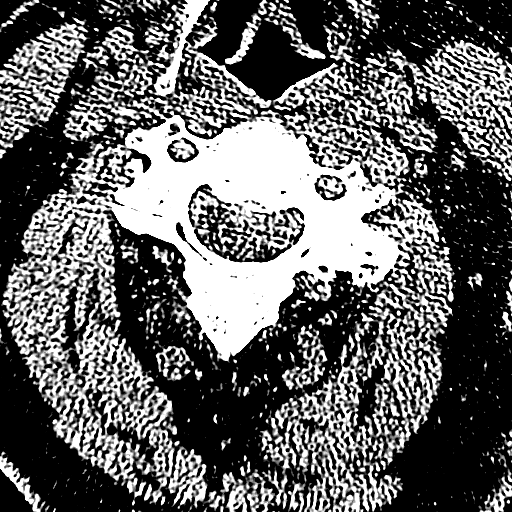
[im 61/84  brain]
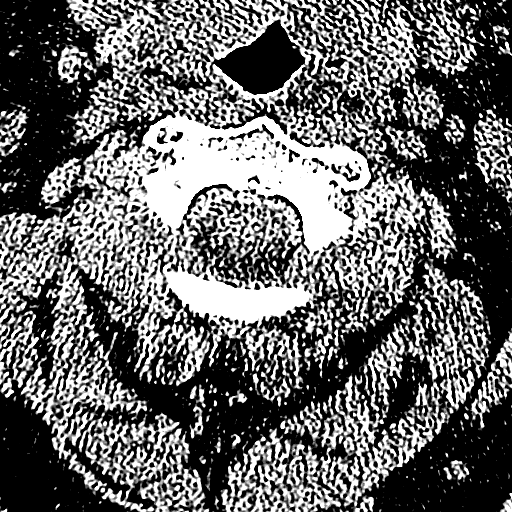
[im 68/84  brain]
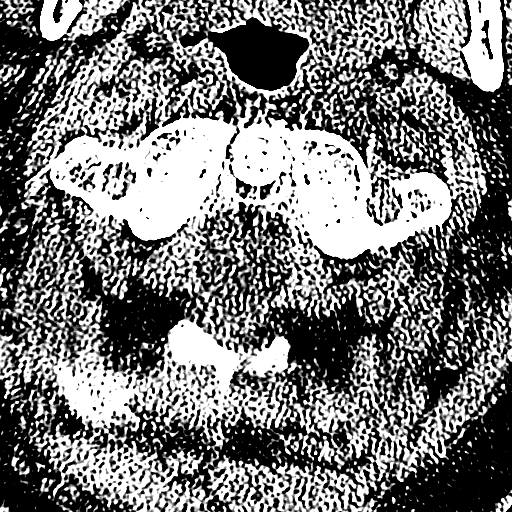
[im 68/84  bone]
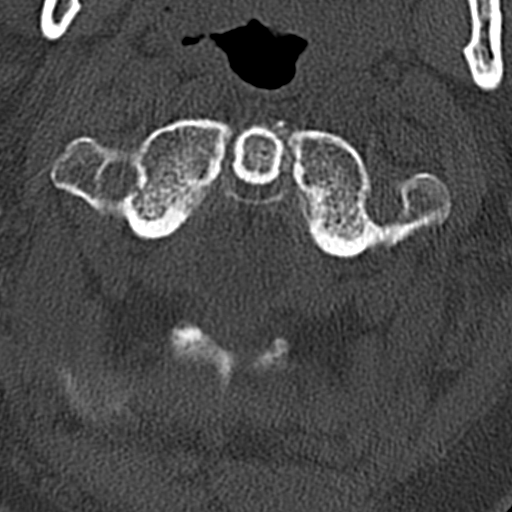
[im 76/84  brain]
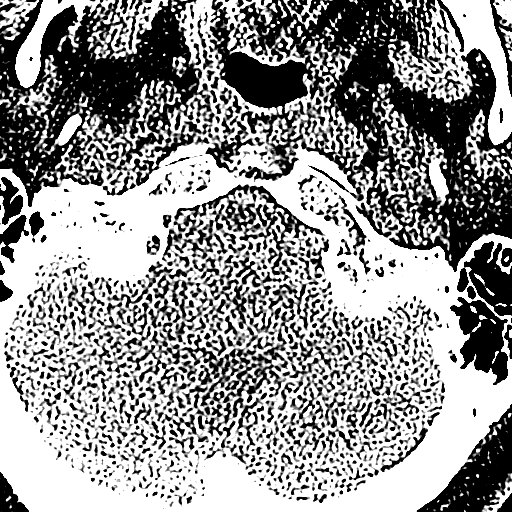

[14 of 47 positions shown; findings below may reference images not displayed]

FINDINGS: CT HEAD FINDINGS

Brain: The brainstem, cerebellum, cerebral peduncles, thalami, basal
ganglia, basilar cisterns, and ventricular system appear within
normal limits. No intracranial hemorrhage, mass lesion, or acute
CVA.

Vascular: There is atherosclerotic calcification of the cavernous
carotid arteries bilaterally.

Skull: Unremarkable

Sinuses/Orbits: Mild chronic right frontal sinusitis.

Other: No supplemental non-categorized findings.

CT CERVICAL SPINE FINDINGS

Alignment: No vertebral subluxation is observed.

Skull base and vertebrae: No fracture or acute bony findings. Mild
pannus posterior to the odontoid measuring 4 mm in thickness.
Degenerative disc disease with loss of disc height at C4-5, C5-6,
and C6-7. Left facet arthropathy especially at C2-3 and C3-4.

Soft tissues and spinal canal: Unremarkable

Disc levels: Mild foraminal narrowing due to uncinate and facet
spurring on the left at C4-5, C5-6, and C6-7; and on the right at
C4-5 and C5-6. Suspected mild central narrowing of the thecal sac at
C6-7 due to spurring and short pedicles.

Upper chest: Unremarkable

Other: No supplemental non-categorized findings.
IMPRESSION: 1. No acute intracranial findings and no acute cervical spine
findings.
2. Atherosclerosis.
3. Mild chronic right frontal sinusitis.
4. Cervical spondylosis and degenerative disc disease with suspected
impingement at C4-5, C5-6, and C6-7 due to spurring.

## 2021-01-29 IMAGING — CT CT PELVIS WITHOUT CONTRAST
2 of 3 series · 15 of 46 positions shown, 17 images · non-contrast
Comparison: None.

CLINICAL DATA: Recent pelvic trauma with no known pubic rami
fractures, evaluate for additional fractures.

EXAM:
CT PELVIS WITHOUT CONTRAST
TECHNIQUE: Multidetector CT imaging of the pelvis was performed following the
standard protocol without intravenous contrast.

[Series 3: axial st · axial · 0.71mm/px · z∈[+142,+372]mm · 12 of 133 slices shown, 14 images]
[im 9/133  soft-tissue]
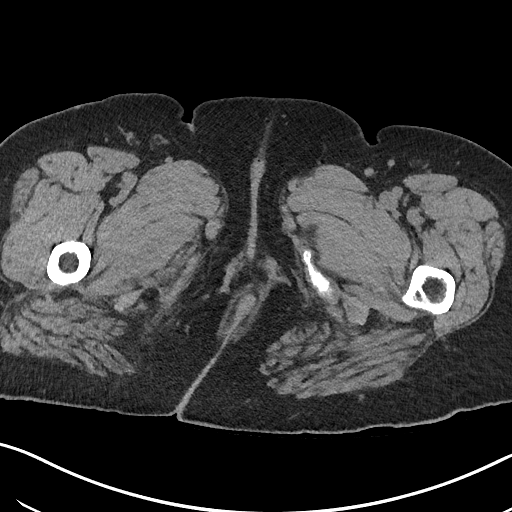
[im 9/133  bone]
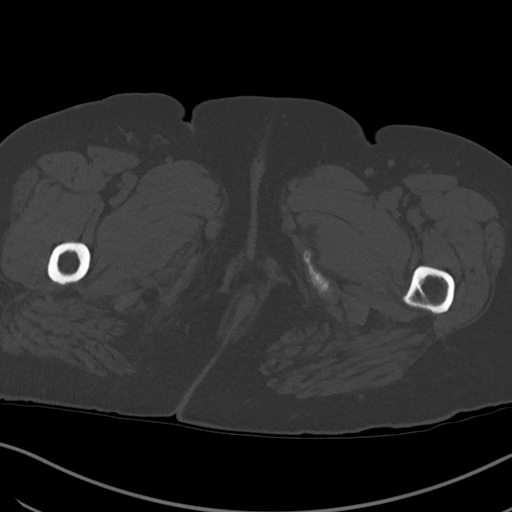
[im 18/133  soft-tissue]
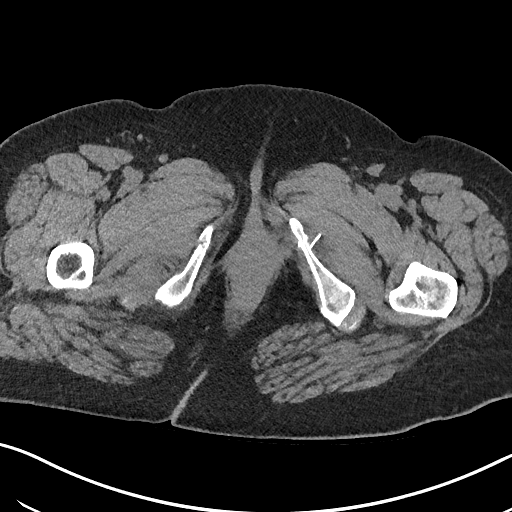
[im 30/133  soft-tissue]
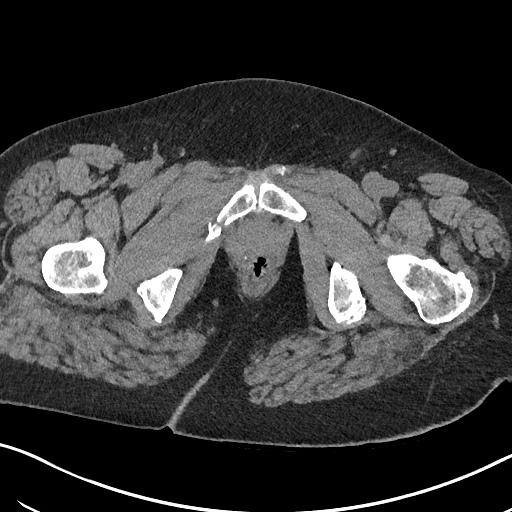
[im 39/133  soft-tissue]
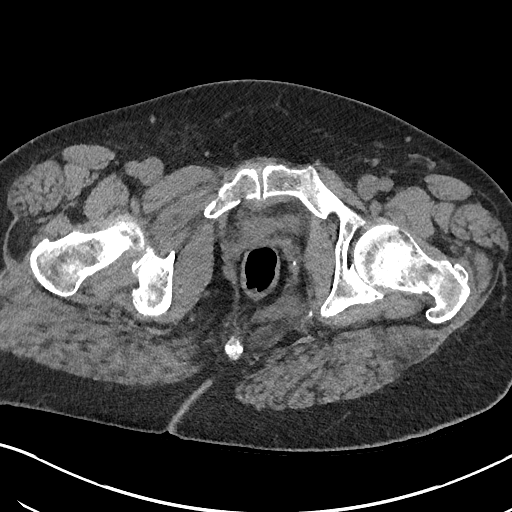
[im 52/133  soft-tissue]
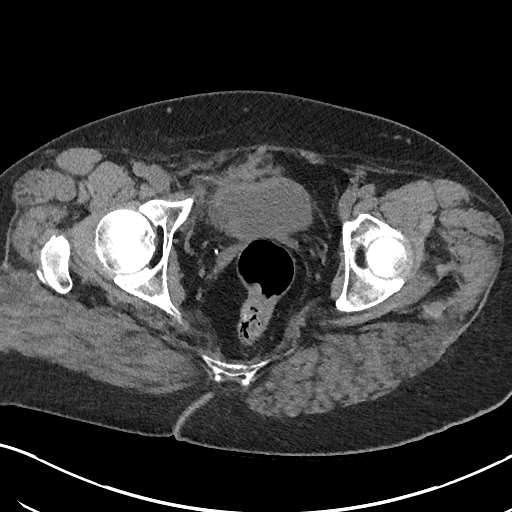
[im 60/133  soft-tissue]
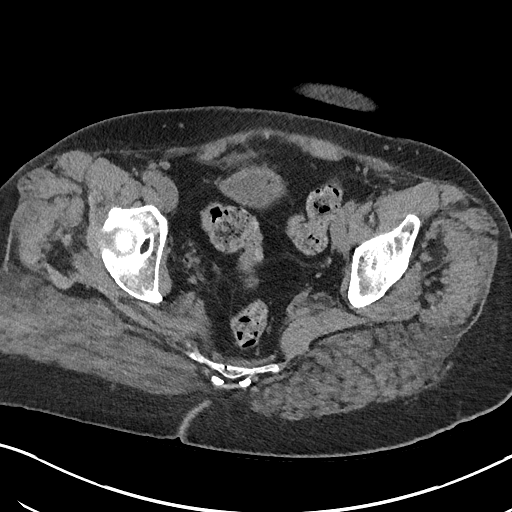
[im 73/133  soft-tissue]
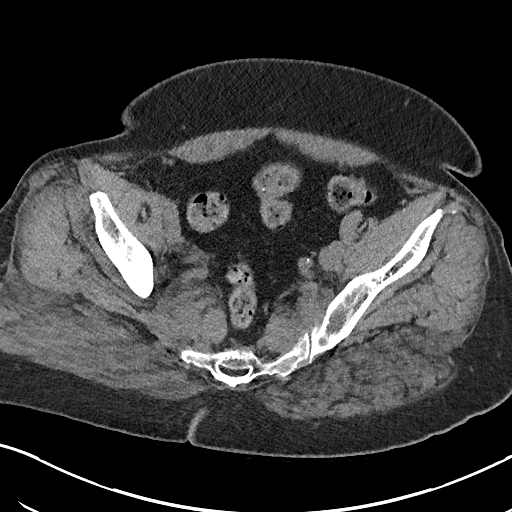
[im 81/133  soft-tissue]
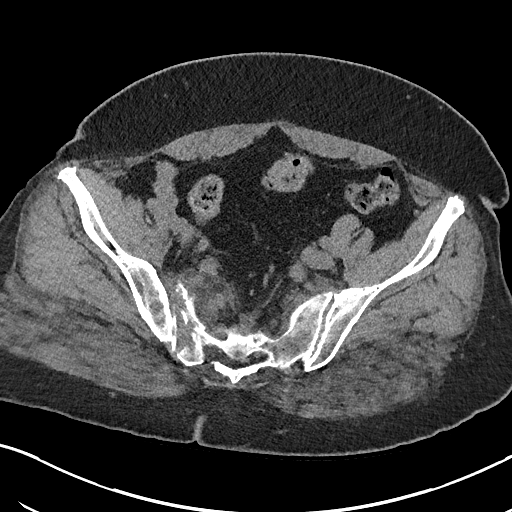
[im 94/133  soft-tissue]
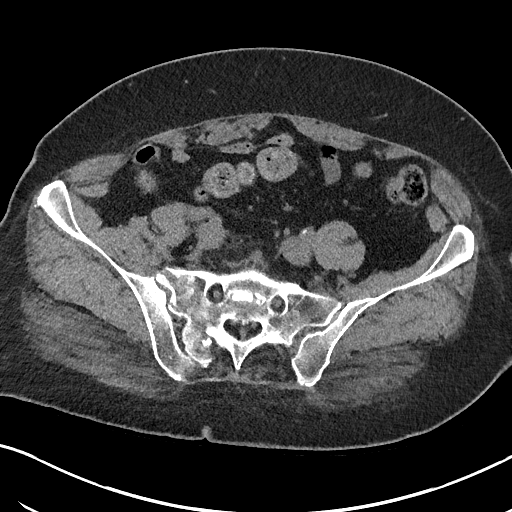
[im 94/133  bone]
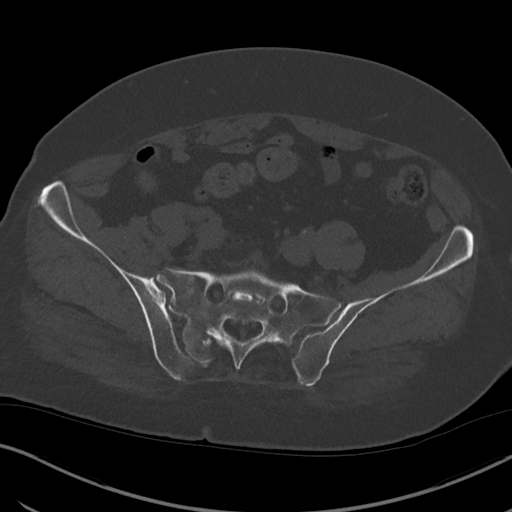
[im 103/133  soft-tissue]
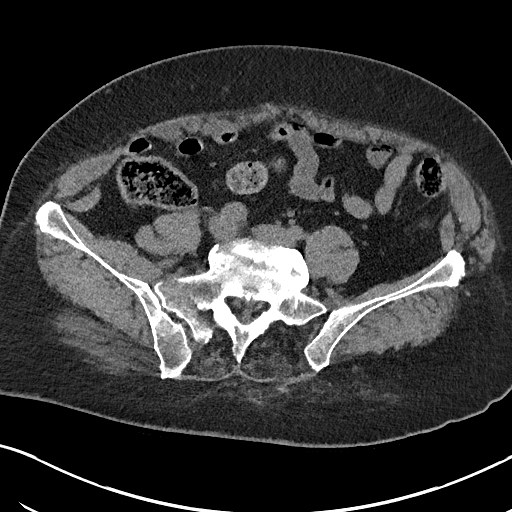
[im 115/133  soft-tissue]
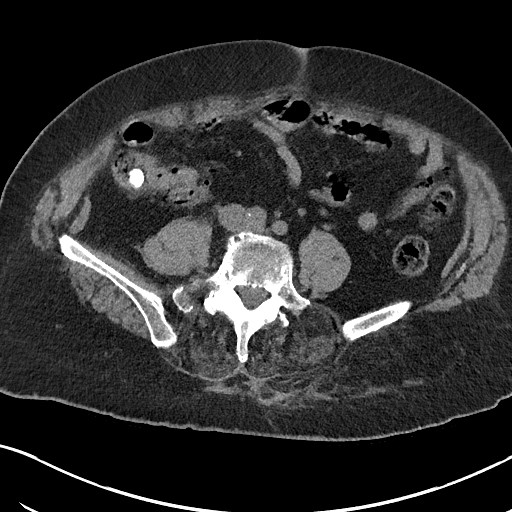
[im 124/133  soft-tissue]
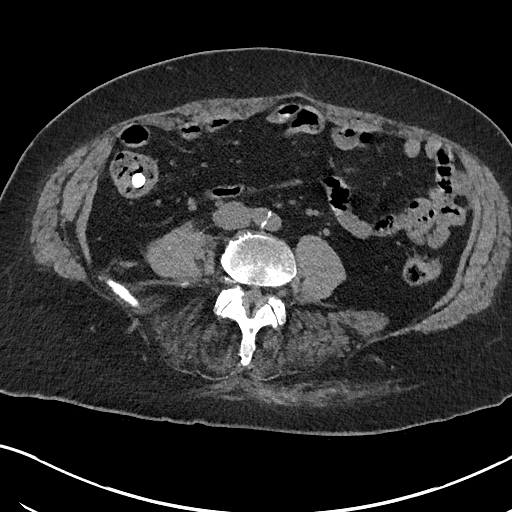

[Series 8: coronal st · coronal · 0.52mm/px · 3 of 99 slices shown]
[im 33/99  soft-tissue]
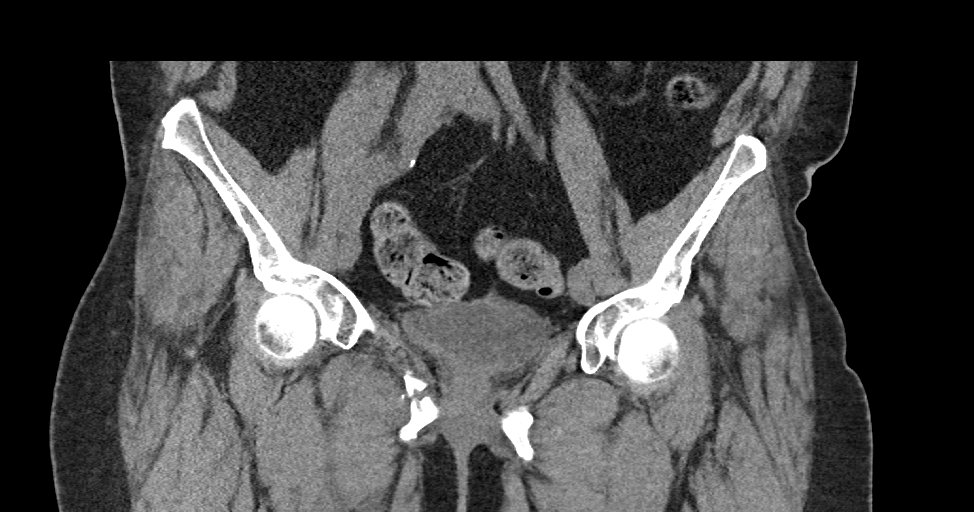
[im 44/99  soft-tissue]
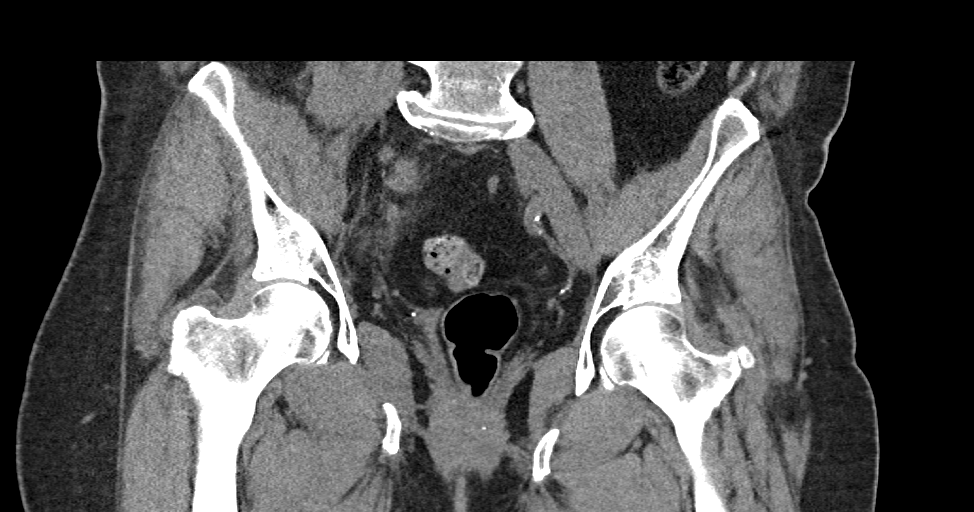
[im 55/99  soft-tissue]
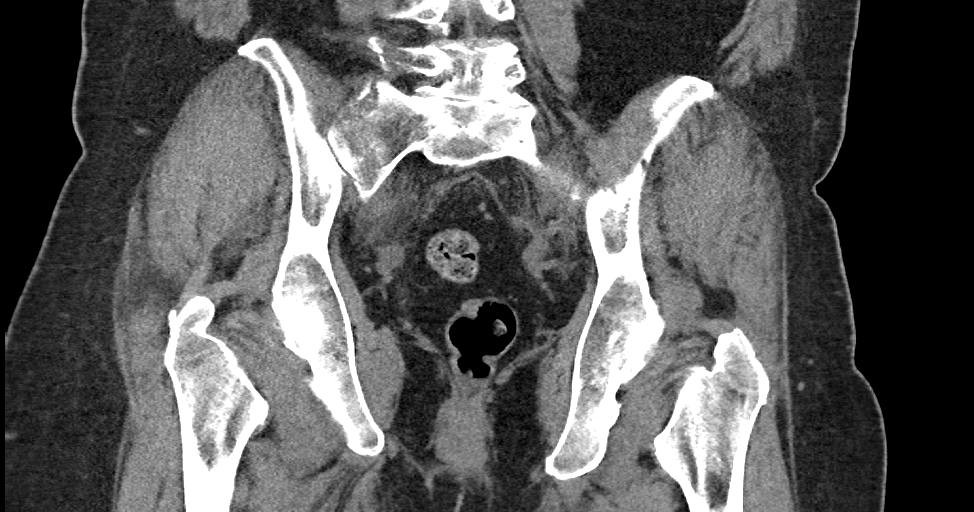

[15 of 46 positions shown; findings below may reference images not displayed]

FINDINGS: Urinary Tract:  Bladder is decompressed.

Bowel:  Visualized large and small bowel is within normal limits.

Vascular/Lymphatic: Vascular calcifications are seen without
aneurysmal dilatation. No significant lymphadenopathy is noted.

Reproductive:  Uterus has been surgically removed.

Other:  No free fluid is noted.  No soft tissue abnormality is seen.

Musculoskeletal: Degenerative changes of lumbar spine are noted.
Mildly displaced fracture through the right sacral ala is seen no
definitive left sacral fracture is seen. Minimally displaced
superior and inferior pubic rami fractures are noted bilaterally. No
focal hematoma is noted. No proximal femoral fractures are seen. No
acetabular involvement is seen.
IMPRESSION: Superior and inferior pubic rami fractures bilaterally as well as
mildly displaced right sacral fracture.
